# Patient Record
Sex: Female | Born: 1980 | Race: Black or African American | Hispanic: No | Marital: Single | Smoking: Never smoker
Health system: Southern US, Community
[De-identification: ages and names within clinical notes are randomized; demographics above are authoritative.]

## PROBLEM LIST (undated history)

## (undated) ENCOUNTER — Inpatient Hospital Stay (HOSPITAL_COMMUNITY): Payer: Self-pay

## (undated) DIAGNOSIS — R51 Headache: Secondary | ICD-10-CM

## (undated) DIAGNOSIS — R519 Headache, unspecified: Secondary | ICD-10-CM

## (undated) DIAGNOSIS — Z8669 Personal history of other diseases of the nervous system and sense organs: Secondary | ICD-10-CM

## (undated) HISTORY — PX: DILATION AND CURETTAGE OF UTERUS: SHX78

---

## 2012-06-08 DIAGNOSIS — Z9889 Other specified postprocedural states: Secondary | ICD-10-CM

## 2014-02-11 ENCOUNTER — Other Ambulatory Visit (HOSPITAL_COMMUNITY): Payer: Self-pay | Admitting: Nurse Practitioner

## 2014-02-11 DIAGNOSIS — Z3689 Encounter for other specified antenatal screening: Secondary | ICD-10-CM

## 2014-02-11 LAB — OB RESULTS CONSOLE RPR: RPR: NONREACTIVE

## 2014-02-11 LAB — OB RESULTS CONSOLE HEPATITIS B SURFACE ANTIGEN: Hepatitis B Surface Ag: NEGATIVE

## 2014-02-11 LAB — OB RESULTS CONSOLE ABO/RH: RH TYPE: POSITIVE

## 2014-02-11 LAB — OB RESULTS CONSOLE GC/CHLAMYDIA
Chlamydia: NEGATIVE
Gonorrhea: NEGATIVE

## 2014-02-11 LAB — OB RESULTS CONSOLE ANTIBODY SCREEN: ANTIBODY SCREEN: NEGATIVE

## 2014-02-11 LAB — OB RESULTS CONSOLE HIV ANTIBODY (ROUTINE TESTING): HIV: NONREACTIVE

## 2014-02-11 LAB — OB RESULTS CONSOLE RUBELLA ANTIBODY, IGM: Rubella: IMMUNE

## 2014-02-19 ENCOUNTER — Encounter (HOSPITAL_COMMUNITY): Payer: Self-pay

## 2014-02-19 ENCOUNTER — Ambulatory Visit (HOSPITAL_COMMUNITY)
Admission: RE | Admit: 2014-02-19 | Discharge: 2014-02-19 | Disposition: A | Discharge status: Home / Self Care | Payer: BC Managed Care – PPO | Source: Ambulatory Visit | Attending: Nurse Practitioner | Admitting: Nurse Practitioner

## 2014-02-19 DIAGNOSIS — Z36 Encounter for antenatal screening of mother: Secondary | ICD-10-CM | POA: Insufficient documentation

## 2014-02-19 DIAGNOSIS — Z3A20 20 weeks gestation of pregnancy: Secondary | ICD-10-CM | POA: Diagnosis not present

## 2014-02-19 DIAGNOSIS — Z3A19 19 weeks gestation of pregnancy: Secondary | ICD-10-CM | POA: Insufficient documentation

## 2014-02-19 DIAGNOSIS — Z3689 Encounter for other specified antenatal screening: Secondary | ICD-10-CM

## 2014-03-05 NOTE — L&D Delivery Note (Signed)
Delivery Note Pushed well to SVD.  At 4:39 AM a viable and healthy female was delivered via Vaginal, Spontaneous Delivery (Presentation: Left Occiput Anterior).  APGAR: 8, 9; weight  .   Placenta status: Intact, Spontaneous.  Cord: 3 vessels with the following complications: Nuchal x 1 loose, reduced prior to delivery of shoulders.   Anesthesia: Local  Episiotomy:  None Lacerations: 2nd degree Suture Repair: 3.0 vicryl rapide Est. Blood Loss (mL):    Mom to postpartum.  Baby to Couplet care / Skin to Skin.  [facility] LLCWILLIAMS,MARIE 07/21/2014, 5:11 AM

## 2014-06-18 LAB — OB RESULTS CONSOLE GC/CHLAMYDIA
CHLAMYDIA, DNA PROBE: NEGATIVE
GC PROBE AMP, GENITAL: NEGATIVE

## 2014-07-09 ENCOUNTER — Ambulatory Visit (HOSPITAL_COMMUNITY)
Admission: RE | Admit: 2014-07-09 | Discharge: 2014-07-09 | Disposition: A | Discharge status: Home / Self Care | Payer: BLUE CROSS/BLUE SHIELD | Source: Ambulatory Visit | Attending: Nurse Practitioner | Admitting: Nurse Practitioner

## 2014-07-09 ENCOUNTER — Other Ambulatory Visit (HOSPITAL_COMMUNITY): Payer: Self-pay | Admitting: Nurse Practitioner

## 2014-07-09 DIAGNOSIS — Z3A39 39 weeks gestation of pregnancy: Secondary | ICD-10-CM | POA: Diagnosis not present

## 2014-07-09 DIAGNOSIS — O26843 Uterine size-date discrepancy, third trimester: Secondary | ICD-10-CM | POA: Diagnosis present

## 2014-07-11 ENCOUNTER — Encounter (HOSPITAL_COMMUNITY): Payer: Self-pay

## 2014-07-11 ENCOUNTER — Inpatient Hospital Stay (HOSPITAL_COMMUNITY)
Admission: AD | Admit: 2014-07-11 | Discharge: 2014-07-12 | Disposition: A | Discharge status: Home / Self Care | Payer: BLUE CROSS/BLUE SHIELD | Source: Ambulatory Visit | Attending: Obstetrics & Gynecology | Admitting: Obstetrics & Gynecology

## 2014-07-11 DIAGNOSIS — O368130 Decreased fetal movements, third trimester, not applicable or unspecified: Secondary | ICD-10-CM | POA: Insufficient documentation

## 2014-07-11 DIAGNOSIS — Z3A39 39 weeks gestation of pregnancy: Secondary | ICD-10-CM | POA: Insufficient documentation

## 2014-07-11 DIAGNOSIS — O9989 Other specified diseases and conditions complicating pregnancy, childbirth and the puerperium: Secondary | ICD-10-CM | POA: Insufficient documentation

## 2014-07-11 LAB — OB RESULTS CONSOLE GBS: STREP GROUP B AG: NEGATIVE

## 2014-07-11 NOTE — MAU Note (Signed)
Pt states had a "hot flash" and baby was moving more than normal for an hour then abruptly stopped moving for about 15 minutes; started moving again but not as much.

## 2014-07-12 ENCOUNTER — Encounter (HOSPITAL_COMMUNITY): Payer: Self-pay | Admitting: *Deleted

## 2014-07-12 DIAGNOSIS — O368131 Decreased fetal movements, third trimester, fetus 1: Secondary | ICD-10-CM

## 2014-07-12 DIAGNOSIS — Z3A39 39 weeks gestation of pregnancy: Secondary | ICD-10-CM | POA: Diagnosis not present

## 2014-07-12 DIAGNOSIS — Z3A40 40 weeks gestation of pregnancy: Secondary | ICD-10-CM

## 2014-07-12 DIAGNOSIS — O368130 Decreased fetal movements, third trimester, not applicable or unspecified: Secondary | ICD-10-CM | POA: Diagnosis not present

## 2014-07-12 DIAGNOSIS — O9989 Other specified diseases and conditions complicating pregnancy, childbirth and the puerperium: Secondary | ICD-10-CM | POA: Diagnosis not present

## 2014-07-12 NOTE — Discharge Instructions (Signed)

## 2014-07-12 NOTE — MAU Provider Note (Signed)
  History    G3P00020 in with c/o having hot flash then decreased fetal movement for 20 min. She then decided to come in for eval. CSN: [id number]  Arrival date and time: 07/11/14 2344   None     Chief Complaint  Patient presents with  . Decreased Fetal Movement   HPI  OB History    Gravida Para Term Preterm AB TAB SAB Ectopic Multiple Living   3    2  2          Past Medical History  Diagnosis Date  . Medical history non-contributory     Past Surgical History  Procedure Laterality Date  . No past surgeries      No family history on file.  History  Substance Use Topics  . Smoking status: Never Smoker   . Smokeless tobacco: Not on file  . Alcohol Use: No    Allergies: Allergies not on file  No prescriptions prior to admission    Review of Systems  Constitutional: Negative.   HENT: Negative.   Eyes: Negative.   Respiratory: Negative.   Cardiovascular: Negative.   Gastrointestinal: Negative.   Genitourinary: Negative.   Musculoskeletal: Negative.   Skin: Negative.   Neurological: Negative.   Endo/Heme/Allergies: Negative.   Psychiatric/Behavioral: Negative.    Physical Exam   Blood pressure 119/74, pulse 76, temperature 98.3 F (36.8 C), temperature source Oral, resp. rate 18, height 5\' 4"  (1.626 m), weight 161 lb 3.2 oz (73.12 kg), last menstrual period 10/09/2013.  Physical Exam  Constitutional: She is oriented to person, place, and time. She appears well-developed and well-nourished.  HENT:  Head: Normocephalic.  Eyes: Pupils are equal, round, and reactive to light.  Neck: Normal range of motion.  Cardiovascular: Normal rate, regular rhythm, normal heart sounds and intact distal pulses.   Respiratory: Effort normal and breath sounds normal.  GI: Soft. Bowel sounds are normal.  Genitourinary: Vagina normal and uterus normal.  Musculoskeletal: Normal range of motion.  Neurological: She is alert and oriented to person, place, and time. She has  normal reflexes.  Skin: Skin is warm and dry.  Psychiatric: She has a normal mood and affect. Her behavior is normal. Judgment and thought content normal.    MAU Course  Procedures  MDM Reactive FHR tracings  Assessment and Plan  FHR pattern reassurring will d/c home  Dushaun Okey DARLENE 07/12/2014, 12:27 AM

## 2014-07-13 ENCOUNTER — Ambulatory Visit (HOSPITAL_COMMUNITY): Payer: BLUE CROSS/BLUE SHIELD

## 2014-07-16 ENCOUNTER — Other Ambulatory Visit (HOSPITAL_COMMUNITY): Payer: Self-pay | Admitting: Physician Assistant

## 2014-07-16 DIAGNOSIS — O480 Post-term pregnancy: Secondary | ICD-10-CM

## 2014-07-20 ENCOUNTER — Telehealth (HOSPITAL_COMMUNITY): Payer: Self-pay | Admitting: *Deleted

## 2014-07-20 ENCOUNTER — Ambulatory Visit (HOSPITAL_COMMUNITY)
Admission: RE | Admit: 2014-07-20 | Discharge: 2014-07-20 | Disposition: A | Discharge status: Home / Self Care | Payer: BLUE CROSS/BLUE SHIELD | Source: Ambulatory Visit | Attending: Physician Assistant | Admitting: Physician Assistant

## 2014-07-20 ENCOUNTER — Encounter (HOSPITAL_COMMUNITY): Payer: Self-pay | Admitting: *Deleted

## 2014-07-20 ENCOUNTER — Inpatient Hospital Stay (HOSPITAL_COMMUNITY)
Admission: AD | Admit: 2014-07-20 | Discharge: 2014-07-23 | DRG: 775 | Disposition: A | Discharge status: Home / Self Care | Payer: BLUE CROSS/BLUE SHIELD | Source: Ambulatory Visit | Attending: Family Medicine | Admitting: Family Medicine

## 2014-07-20 DIAGNOSIS — Z3A40 40 weeks gestation of pregnancy: Secondary | ICD-10-CM | POA: Diagnosis present

## 2014-07-20 DIAGNOSIS — O4100X0 Oligohydramnios, unspecified trimester, not applicable or unspecified: Secondary | ICD-10-CM | POA: Diagnosis present

## 2014-07-20 DIAGNOSIS — O480 Post-term pregnancy: Secondary | ICD-10-CM | POA: Diagnosis present

## 2014-07-20 DIAGNOSIS — O4103X0 Oligohydramnios, third trimester, not applicable or unspecified: Principal | ICD-10-CM | POA: Diagnosis present

## 2014-07-20 DIAGNOSIS — IMO0001 Reserved for inherently not codable concepts without codable children: Secondary | ICD-10-CM

## 2014-07-20 HISTORY — DX: Headache, unspecified: R51.9

## 2014-07-20 HISTORY — DX: Headache: R51

## 2014-07-20 LAB — CBC
HCT: 36.6 % (ref 36.0–46.0)
HEMOGLOBIN: 12.2 g/dL (ref 12.0–15.0)
MCH: 29 pg (ref 26.0–34.0)
MCHC: 33.3 g/dL (ref 30.0–36.0)
MCV: 87.1 fL (ref 78.0–100.0)
PLATELETS: 120 10*3/uL — AB (ref 150–400)
RBC: 4.2 MIL/uL (ref 3.87–5.11)
RDW: 13.6 % (ref 11.5–15.5)
WBC: 8.6 10*3/uL (ref 4.0–10.5)

## 2014-07-20 LAB — TYPE AND SCREEN
ABO/RH(D): O POS
Antibody Screen: NEGATIVE

## 2014-07-20 LAB — ABO/RH: ABO/RH(D): O POS

## 2014-07-20 MED ORDER — LACTATED RINGERS IV SOLN: 500.0000 mL | INTRAVENOUS | Status: DC | PRN

## 2014-07-20 MED ORDER — LIDOCAINE HCL (PF) 1 % IJ SOLN
30.0000 mL | INTRAMUSCULAR | Status: AC | PRN
  Administered 2014-07-21: 30 mL via SUBCUTANEOUS
  Filled 2014-07-20: qty 30

## 2014-07-20 MED ORDER — LACTATED RINGERS IV SOLN
INTRAVENOUS | Status: DC
  Administered 2014-07-20 (×2): via INTRAVENOUS

## 2014-07-20 MED ORDER — OXYTOCIN 40 UNITS IN LACTATED RINGERS INFUSION - SIMPLE MED: 62.5000 mL/h | INTRAVENOUS | Status: DC

## 2014-07-20 MED ORDER — TERBUTALINE SULFATE 1 MG/ML IJ SOLN: 0.2500 mg | Freq: Once | INTRAMUSCULAR | Status: AC | PRN

## 2014-07-20 MED ORDER — ONDANSETRON HCL 4 MG/2ML IJ SOLN: 4.0000 mg | Freq: Four times a day (QID) | INTRAMUSCULAR | Status: DC | PRN

## 2014-07-20 MED ORDER — MISOPROSTOL 200 MCG PO TABS
50.0000 ug | ORAL_TABLET | ORAL | Status: DC
  Administered 2014-07-20: 50 ug via ORAL

## 2014-07-20 MED ORDER — OXYTOCIN BOLUS FROM INFUSION: 500.0000 mL | INTRAVENOUS | Status: DC

## 2014-07-20 MED ORDER — ACETAMINOPHEN 325 MG PO TABS: 650.0000 mg | ORAL_TABLET | ORAL | Status: DC | PRN

## 2014-07-20 MED ORDER — OXYCODONE-ACETAMINOPHEN 5-325 MG PO TABS: 1.0000 | ORAL_TABLET | ORAL | Status: DC | PRN

## 2014-07-20 MED ORDER — FLEET ENEMA 7-19 GM/118ML RE ENEM: 1.0000 | ENEMA | RECTAL | Status: DC | PRN

## 2014-07-20 MED ORDER — CITRIC ACID-SODIUM CITRATE 334-500 MG/5ML PO SOLN: 30.0000 mL | ORAL | Status: DC | PRN

## 2014-07-20 MED ORDER — OXYCODONE-ACETAMINOPHEN 5-325 MG PO TABS: 2.0000 | ORAL_TABLET | ORAL | Status: DC | PRN

## 2014-07-20 MED ORDER — OXYTOCIN 40 UNITS IN LACTATED RINGERS INFUSION - SIMPLE MED
1.0000 m[IU]/min | INTRAVENOUS | Status: DC
  Administered 2014-07-20: 2 m[IU]/min via INTRAVENOUS
  Filled 2014-07-20: qty 1000

## 2014-07-20 NOTE — H&P (Signed)
LABOR ADMISSION HISTORY AND PHYSICAL  Lindsey Mcbride is a 34 y.o. female G3P0020 with IUP at 5958w4d by LMP presenting for IOL 2/2 oligohydramnios and post-dates. She reports +FM, no contractions, No LOF, no VB, no blurry vision, and RUQ pain.  She plans on wants to breast and bottle feed feeding. She is undecided for birth control.  Of note patient has had 2 spontaneous abortions that she does not want to talk about in front of others.  ROS: +peripheral edema the past month +headaches last 3 days on and off  Dating: By LMP, US was performed at 647w1d.--->  Estimated Date of Delivery: 07/16/14   Prenatal History/Complications:  Past Medical History: Past Medical History  Diagnosis Date  . Medical history non-contributory   . Headache     Past Surgical History: Past Surgical History  Procedure Laterality Date  . No past surgeries      Obstetrical History: OB History    Gravida Para Term Preterm AB TAB SAB Ectopic Multiple Living   3    2  2          Social History: History   Social History  . Marital Status: Single    Spouse Name: N/A  . Number of Children: N/A  . Years of Education: N/A   Social History Main Topics  . Smoking status: Never Smoker   . Smokeless tobacco: Never Used  . Alcohol Use: No  . Drug Use: No  . Sexual Activity: Yes   Other Topics Concern  . None   Social History Narrative    Family History: History reviewed. No pertinent family history.  Allergies: No Known Allergies  Prescriptions prior to admission  Medication Sig Dispense Refill Last Dose  . Prenatal Vit-Fe Fumarate-FA (PRENATAL MULTIVITAMIN) TABS tablet Take 1 tablet by mouth at bedtime.   07/19/2014 at Unknown time     Review of Systems   All systems reviewed and negative except as stated in HPI  BP 123/79 mmHg  Pulse 77  Temp(Src) 98.1 F (36.7 C) (Oral)  Resp 18  Ht 5\' 4"  (1.626 m)  Wt 164 lb (74.39 kg)  BMI 28.14 kg/m2  LMP 10/09/2013 General appearance:  alert, cooperative and no distress Lungs: clear to auscultation bilaterally Heart: regular rate and rhythm Abdomen: soft, non-tender; bowel sounds normal Pelvic: 1/50/-3 Extremities: Homans sign is negative, no sign of DVT, edema DTR's present Presentation: cephalic Fetal monitoringBaseline: 150 bpm Uterine activityNone Dilation: 1 Effacement (%): 50 Station: -3 Exam by:: Dr. Loreta AveAcosta   Prenatal labs: ABO, Rh: --/--/O POS (05/17 1535)  Antibody: PENDING (05/17 1535) Rubella:   Immune RPR: Nonreactive (12/10 0000)  HBsAg: Negative (12/10 0000)  HIV: Non-reactive (12/10 0000)  GBS: Negative (05/08 0000)  1 hr Glucola 156, 3hr CTT 91/171/138/139 Genetic screening  Declined  Prenatal Transfer Tool  Maternal Diabetes: No Genetic Screening: Declined Maternal Ultrasounds/Referrals: Normal Fetal Ultrasounds or other Referrals:  None Maternal Substance Abuse:  No Significant Maternal Medications:  None Significant Maternal Lab Results: None  Results for orders placed or performed during the hospital encounter of 07/20/14 (from the past 24 hour(s))  CBC   Collection Time: 07/20/14  3:35 PM  Result Value Ref Range   WBC 8.6 4.0 - 10.5 K/uL   RBC 4.20 3.87 - 5.11 MIL/uL   Hemoglobin 12.2 12.0 - 15.0 g/dL   HCT 16.136.6 09.636.0 - 04.546.0 %   MCV 87.1 78.0 - 100.0 fL   MCH 29.0 26.0 - 34.0 pg   MCHC 33.3  30.0 - 36.0 g/dL   RDW 16.113.6 09.611.5 - 04.515.5 %   Platelets 120 (L) 150 - 400 K/uL  Type and screen   Collection Time: 07/20/14  3:35 PM  Result Value Ref Range   ABO/RH(D) O POS    Antibody Screen PENDING    Sample Expiration 07/23/2014     Patient Active Problem List   Diagnosis Date Noted  . Oligohydramnios 07/20/2014  . Encounter for fetal anatomic survey   . [redacted] weeks gestation of pregnancy     Assessment: Lindsey Mcbride Aure is a 34 y.o. G3P0020 at 6466w4d here for IOL for oligohydramnios from clinic.   #Labor: IOL for oligohydramnios.   -Will place FB and start PO  cytotec #Pain: Patient wanting no pain medications at this time.  #FWB:  NST reassuring, CAT1,  no contractions currently #ID: GBS/HIV/RPR neg #MOF: Breast and Bottle #MOC: Undecided #Circ:  If its a boy want a circ. (FAMILY DOES NOT KNOW SEX OF BABY; wants it to be a surprise.)  Lindsey AdaJazma Phelps, DO PGY-1, [facility]   OB fellow attestation:  I have seen and examined this patient; I agree with above documentation in the resident's note.   Lindsey Mcbride is a 34 y.o. G3P0020 here for IOL 3/3 oligo  PE: BP 123/79 mmHg  Pulse 77  Temp(Src) 98.1 F (36.7 C) (Oral)  Resp 18  Ht 5\' 4"  (1.626 m)  Wt 164 lb (74.39 kg)  BMI 28.14 kg/m2  LMP 10/09/2013 Gen: calm comfortable, NAD Resp: normal effort, no distress Abd: gravid  ROS, labs, PMH reviewed  Plan: MOF: breast MOC: undecided ID: GBS neg FWB: cat I Labor: FB and PO cytotec Pain: declines  Lindsey Mcbride 07/20/2014, 7:39 PM

## 2014-07-20 NOTE — Telephone Encounter (Signed)
Preadmission screen  

## 2014-07-20 NOTE — Progress Notes (Signed)
Lindsey Mcbride is a 34 y.o. G3P0020 at 2785w4d by ultrasound admitted for induction of labor due to Low amniotic fluid..  Subjective: Doing well. Foley out @ 1900  Objective: BP 127/78 mmHg  Pulse 86  Temp(Src) 98.3 F (36.8 C) (Oral)  Resp 16  Ht 5\' 4"  (1.626 m)  Wt 164 lb (74.39 kg)  BMI 28.14 kg/m2  LMP 10/09/2013      FHT:  FHR: 135 bpm, variability: moderate,  accelerations:  Present,  decelerations:  Absent UC:   regular, every 4 minutes SVE:   Dilation: 1 Effacement (%): 50 Station: -3 Exam by:: Dr. Loreta AveAcosta  Labs: Lab Results  Component Value Date   WBC 8.6 07/20/2014   HGB 12.2 07/20/2014   HCT 36.6 07/20/2014   MCV 87.1 07/20/2014   PLT 120* 07/20/2014    Assessment / Plan: Induction of labor due to oligohydramnios,  progressing well on pitocin  Labor: Progressing normally and Will start Pitocin Preeclampsia:  n/a Fetal Wellbeing:  Category I Pain Control:  Labor support without medications I/D:  n/a Anticipated MOD:  NSVD  Rita Prom 07/20/2014, 9:03 PM

## 2014-07-21 ENCOUNTER — Encounter (HOSPITAL_COMMUNITY): Payer: Self-pay

## 2014-07-21 DIAGNOSIS — O480 Post-term pregnancy: Secondary | ICD-10-CM

## 2014-07-21 DIAGNOSIS — IMO0001 Reserved for inherently not codable concepts without codable children: Secondary | ICD-10-CM

## 2014-07-21 DIAGNOSIS — O4103X0 Oligohydramnios, third trimester, not applicable or unspecified: Secondary | ICD-10-CM

## 2014-07-21 LAB — RPR: RPR Ser Ql: NONREACTIVE

## 2014-07-21 MED ORDER — SENNOSIDES-DOCUSATE SODIUM 8.6-50 MG PO TABS
2.0000 | ORAL_TABLET | ORAL | Status: DC
  Administered 2014-07-21 – 2014-07-22 (×2): 2 via ORAL
  Filled 2014-07-21 (×2): qty 2

## 2014-07-21 MED ORDER — BENZOCAINE-MENTHOL 20-0.5 % EX AERO
1.0000 "application " | INHALATION_SPRAY | CUTANEOUS | Status: DC | PRN
  Administered 2014-07-21: 1 via TOPICAL
  Filled 2014-07-21: qty 56

## 2014-07-21 MED ORDER — ACETAMINOPHEN 325 MG PO TABS
650.0000 mg | ORAL_TABLET | ORAL | Status: DC | PRN
  Administered 2014-07-21 (×2): 650 mg via ORAL
  Filled 2014-07-21 (×2): qty 2

## 2014-07-21 MED ORDER — ZOLPIDEM TARTRATE 5 MG PO TABS: 5.0000 mg | ORAL_TABLET | Freq: Every evening | ORAL | Status: DC | PRN

## 2014-07-21 MED ORDER — TERBUTALINE SULFATE 1 MG/ML IJ SOLN
0.2500 mg | Freq: Once | INTRAMUSCULAR | Status: AC | PRN
  Filled 2014-07-21: qty 1

## 2014-07-21 MED ORDER — LANOLIN HYDROUS EX OINT: TOPICAL_OINTMENT | CUTANEOUS | Status: DC | PRN

## 2014-07-21 MED ORDER — DIPHENHYDRAMINE HCL 25 MG PO CAPS: 25.0000 mg | ORAL_CAPSULE | Freq: Four times a day (QID) | ORAL | Status: DC | PRN

## 2014-07-21 MED ORDER — FENTANYL CITRATE (PF) 100 MCG/2ML IJ SOLN
50.0000 ug | INTRAMUSCULAR | Status: DC | PRN
  Administered 2014-07-21 (×3): 50 ug via INTRAVENOUS
  Filled 2014-07-21 (×3): qty 2

## 2014-07-21 MED ORDER — IBUPROFEN 600 MG PO TABS
600.0000 mg | ORAL_TABLET | Freq: Four times a day (QID) | ORAL | Status: DC
  Administered 2014-07-21 – 2014-07-23 (×11): 600 mg via ORAL
  Filled 2014-07-21 (×11): qty 1

## 2014-07-21 MED ORDER — PRENATAL MULTIVITAMIN CH
1.0000 | ORAL_TABLET | Freq: Every day | ORAL | Status: DC
  Administered 2014-07-21 – 2014-07-23 (×3): 1 via ORAL
  Filled 2014-07-21 (×3): qty 1

## 2014-07-21 MED ORDER — OXYCODONE-ACETAMINOPHEN 5-325 MG PO TABS: 2.0000 | ORAL_TABLET | ORAL | Status: DC | PRN

## 2014-07-21 MED ORDER — TETANUS-DIPHTH-ACELL PERTUSSIS 5-2.5-18.5 LF-MCG/0.5 IM SUSP: 0.5000 mL | Freq: Once | INTRAMUSCULAR | Status: DC

## 2014-07-21 MED ORDER — ONDANSETRON HCL 4 MG/2ML IJ SOLN: 4.0000 mg | INTRAMUSCULAR | Status: DC | PRN

## 2014-07-21 MED ORDER — DIBUCAINE 1 % RE OINT: 1.0000 "application " | TOPICAL_OINTMENT | RECTAL | Status: DC | PRN

## 2014-07-21 MED ORDER — SIMETHICONE 80 MG PO CHEW: 80.0000 mg | CHEWABLE_TABLET | ORAL | Status: DC | PRN

## 2014-07-21 MED ORDER — OXYCODONE-ACETAMINOPHEN 5-325 MG PO TABS: 1.0000 | ORAL_TABLET | ORAL | Status: DC | PRN

## 2014-07-21 MED ORDER — WITCH HAZEL-GLYCERIN EX PADS: 1.0000 "application " | MEDICATED_PAD | CUTANEOUS | Status: DC | PRN

## 2014-07-21 MED ORDER — ONDANSETRON HCL 4 MG PO TABS: 4.0000 mg | ORAL_TABLET | ORAL | Status: DC | PRN

## 2014-07-21 NOTE — Lactation Note (Signed)
This note was copied from the chart of Lindsey Mcbride. Lactation Consultation Note  Initial visit attempted at 9 hours of life.  Baby getting ready to have bath. Will follow up.  RN has reviewed hand expression with mother. Mom made aware of O/P services, breastfeeding support groups, community resources, and our phone # for post-discharge questions.    Patient Name: Lindsey Foster Simpsonmarech Abdella Today's Date: 07/21/2014     Maternal Data    Feeding Feeding Type: Breast Fed Length of feed: 20 min  LATCH Score/Interventions Latch: Repeated attempts needed to sustain latch, nipple held in mouth throughout feeding, stimulation needed to elicit sucking reflex. Intervention(s): Assist with latch  Audible Swallowing: A few with stimulation Intervention(s): Skin to skin;Hand expression  Type of Nipple: Everted at rest and after stimulation  Comfort (Breast/Nipple): Soft / non-tender     Hold (Positioning): Assistance needed to correctly position infant at breast and maintain latch.  LATCH Score: 7  Lactation Tools Discussed/Used     Consult Status      Dahlia ByesBerkelhammer, Ruth [facility] 07/21/2014, 2:00 PM

## 2014-07-21 NOTE — Lactation Note (Signed)
This note was copied from the chart of Lindsey Mcbride. Lactation Consultation Note  Noted baby sucking on tongue.  Reviewed suck training with mother. Demonstrated how to hand express and mother was able to express drops of colostrum. Assisted mother in both football on L breast and side lying position on R breast. With compression, sucks and swallows observed. Reviewed cluster feeding and waiting until baby has wide gape before latching. Mom encouraged to feed baby 8-12 times/24 hours and with feeding cues.  Mom made aware of O/P services, breastfeeding support groups, community resources, and our phone # for post-discharge questions.    Patient Name: Lindsey Foster Simpsonmarech Caissie Today's Date: 07/21/2014 Reason for consult: Initial assessment   Maternal Data Has patient been taught Hand Expression?: Yes Does the patient have breastfeeding experience prior to this delivery?: No  Feeding Feeding Type: Breast Fed Length of feed: 20 min  LATCH Score/Interventions Latch: Grasps breast easily, tongue down, lips flanged, rhythmical sucking. Intervention(s): Adjust position;Assist with latch;Breast massage  Audible Swallowing: A few with stimulation Intervention(s): Skin to skin;Hand expression  Type of Nipple: Everted at rest and after stimulation  Comfort (Breast/Nipple): Soft / non-tender     Hold (Positioning): Assistance needed to correctly position infant at breast and maintain latch.  LATCH Score: 8  Lactation Tools Discussed/Used     Consult Status Consult Status: Follow-up Date: 07/22/14 Follow-up type: In-patient    Dahlia ByesBerkelhammer, Janda Cargo [facility] 07/21/2014, 2:40 PM

## 2014-07-22 NOTE — Lactation Note (Signed)
This note was copied from the chart of Lindsey Brittnie Meras. Lactation Consultation Note  Patient Name: Lindsey Mcbride EXBMW'UToday's Date: 07/22/2014 Reason for consult: Follow-up assessment Baby 35 hours of life. Mom called out for a DEBP. Mom states that baby continues to tongue-suck/thrust, and pushes breast out of mouth. Mom reports that she is able to hand express colostrum and baby latches well initially, but won't maintain a latch. Set mom up with DEBP and started her pumping. Enc mom to pump every 3 hours for 15 minutes. Mom states that baby was just at the breast and is sleeping well now in the arms of family member. Enc mom to put baby to breast first with cues, then use DEBP and give baby whatever she gets from pumping. Enc mom to hand express after pumping. Mom given curve-tipped syringe with instructions to use with clean/gloved finger for additional suck-training. Assisted mom to hand express after pumping and obtained a few drops which were given to baby. Assisted mom to latch baby onto breast first in football position, and then in cross-cradle with improved latched. Mom reports no pinching of nipple in cross-cradle position. Changed baby's diaper, and it was a transitional stool/brown. Enc mom to keep nursing, and pumping, and then depending on how much she is getting with DEBP and how satisfied baby is at the breast, baby may need to be supplemented with formula. Mom not wanting to supplement now. Gave mom the nursery Fax number for her insurance company to fax over a document for her OB to fill out in the morning for a DEBP. Discussed assessment, interventions, and plan with patient's RN, Juliette AlcideMelinda.    Maternal Data    Feeding Length of feed: 10 min  LATCH Score/Interventions                      Lactation Tools Discussed/Used Pump Review: Setup, frequency, and cleaning Initiated by:: JW Date initiated:: 07/23/14   Consult Status Consult Status: Follow-up Date:  07/23/14 Follow-up type: In-patient    Geralynn OchsWILLIARD, Damion Kant 07/22/2014, 4:28 PM

## 2014-07-22 NOTE — Progress Notes (Cosign Needed)
Post Partum Day #1 Subjective: no complaints, up ad lib, voiding, tolerating PO and no flatus. Not comfortable going home today, continues to work on breastfeeding.  Objective: Blood pressure 110/56, pulse 78, temperature 98.1 F (36.7 C), temperature source Oral, resp. rate 18, height 5\' 4"  (1.626 m), weight 74.39 kg (164 lb), last menstrual period 10/09/2013, SpO2 100 %, unknown if currently breastfeeding.  Physical Exam:  General: alert and cooperative, well-appearing, NAD Lochia: appropriate Uterine Fundus: firm, at umbilicus DVT Evaluation: No evidence of DVT seen on physical exam. Negative Homan's sign. No cords or calf tenderness. No significant calf/ankle edema.   Recent Labs  07/20/14 1535  HGB 12.2  HCT 36.6    Assessment/Plan: Lindsey Mcbride is a 34 y.o. G3P1021 at 7321w5d delivered healthy baby girl on 5/18 at 0430 without complications. GBS negative - Continue breast and bottle feeding - Pain controlled - Continue PO and ambulation, no flatus yet - Undecided contraception - Anticipate DC tomorrow   LOS: 2 days   Saralyn PilarKaramalegos, Alexander 07/22/2014, 8:47 AM

## 2014-07-23 ENCOUNTER — Inpatient Hospital Stay (HOSPITAL_COMMUNITY): Admission: RE | Admit: 2014-07-23 | Payer: BLUE CROSS/BLUE SHIELD | Source: Ambulatory Visit

## 2014-07-23 MED ORDER — IBUPROFEN 600 MG PO TABS: 600.0000 mg | ORAL_TABLET | Freq: Four times a day (QID) | ORAL | Status: DC

## 2014-07-23 MED ORDER — DOCUSATE SODIUM 100 MG PO CAPS: 100.0000 mg | ORAL_CAPSULE | Freq: Two times a day (BID) | ORAL | Status: DC

## 2014-07-23 NOTE — Lactation Note (Signed)
This note was copied from the chart of Lindsey Mahiya Convey. Lactation Consultation Note  Patient Name: Lindsey Mcbride: 07/23/2014 Reason for consult: Follow-up assessment Mom reports baby has been popping on/off the breast. She has nipple pain with nursing. Positional stripes present bilateral. Both nipples are erect but with short shaft. Baby is tongue thrusting with nursing per Mom and LC observed this with this feeding. LC worked with Mom with positioning, bringing bottom lip down for more depth but Mom reported continued nipple pain. PS=7 decreasing to 5. Compression line visible with nursing. Initiated #20 nipple shield and Mom reported some discomfort with initial latch but this resolved with baby nursing. Baby did not pop on/off the breast, sustained more depth with latch. No compression line visible at the end of the feeding, right nipple with superficial cracking, no bleeding. Scant amount of colostrum in nipple shield, colostrum present with hand expression. Mom pumping received a few drops, with hand expression received approx 1 ml, demonstrated to Mom how to spoon feed small amounts back to baby. Baby acting very hungry and Mom concerned about weight loss at 8%, wants to supplement. LC assisted and Mom demonstrated how to supplement finger feeding using curved tipped syringe. LC advised Mom that baby should be at the breast 8-12 times or more in 24 hours, nursing 15-30 minutes both breasts each feeding. If baby satisfied, breasts soft after nursing, adequate voids/stools and Mom is observing breast milk in the nipple shield she does not have to supplement. However, if baby not satisfied at the breast with nursing frequently, Mom not observing BM in nipple shield, inadequate voids/stools, then Mom to supplement with feedings according to guidelines given. Mom reports her breast are feeling heavier between feedings and baby's stools are changing color so feel Mom's milk will be in  soon. Mom plans to rent DEBP for home use till her pump arrives from insurance company. Encouraged to pump after feedings to have EBM to supplement with instead of formula. Give baby back any amount of EBM she receives.  Care for sore nipples reviewed, comfort gels given with instructions. OP f/u with lactation scheduled for Thursday, 07/29/14 at 1:00 pm.   Maternal Data    Feeding Feeding Type: Breast Fed Length of feed: 14 min  LATCH Score/Interventions Latch: Repeated attempts needed to sustain latch, nipple held in mouth throughout feeding, stimulation needed to elicit sucking reflex. Intervention(s): Adjust position;Assist with latch;Breast massage;Breast compression  Audible Swallowing: A few with stimulation  Type of Nipple: Everted at rest and after stimulation (short nipple shaft bilateral/aerola edema)  Comfort (Breast/Nipple): Filling, red/small blisters or bruises, mild/mod discomfort  Problem noted: Cracked, bleeding, blisters, bruises Interventions  (Cracked/bleeding/bruising/blister): Expressed breast milk to nipple;Other (comment) (comfort gels)  Hold (Positioning): Assistance needed to correctly position infant at breast and maintain latch. Intervention(s): Breastfeeding basics reviewed;Support Pillows;Position options;Skin to skin  LATCH Score: 6  Lactation Tools Discussed/Used Tools: Nipple Shields;Pump;Comfort gels Nipple shield size: 20 Breast pump type: Double-Electric Breast Pump   Consult Status Consult Status: Follow-up Mcbride: 07/23/14 Follow-up type: In-patient    Alfred LevinsGranger, Sulma Ruffino Ann 07/23/2014, 9:35 AM

## 2014-07-23 NOTE — Discharge Summary (Signed)
Obstetric Discharge Summary Reason for Admission: induction of labor, oligohydramnios and post-dates Prenatal Procedures: NST and ultrasound Intrapartum Procedures: spontaneous vaginal delivery Postpartum Procedures: none Complications-Operative and Postpartum: 2nd degree perineal laceration s/p repair HEMOGLOBIN  Date Value Ref Range Status  07/20/2014 12.2 12.0 - 15.0 g/dL Final   HCT  Date Value Ref Range Status  07/20/2014 36.6 36.0 - 46.0 % Final    Discharge Diagnoses: Post-date pregnancy delivered. Oligohydramnios  Hospital Course:  Lindsey Mcbride is a 34 y.o. G3P1021 at 6465w5d who was admitted on 07/20/14 for IOL for post-dates with oligohydramnios. Pregnancy was uncomplicated. Maternal GBS negative. Progressed to vaginal delivery within 24 hours (see copied delivery note below). Postpartum course was uncomplicated, tolerating PO and ambulation, pain controlled, bleeding improved, +flatus/BM, and no barriers to discharge. Plan for continue breast feeding, contraception (undecided), follow-up in 4-6 weeks for postpartum visit.  Delivery Note Pushed well to SVD. At 4:39 AM a viable and healthy female was delivered via Vaginal, Spontaneous Delivery (Presentation: Left Occiput Anterior). APGAR: 8, 9; weight .  Placenta status: Intact, Spontaneous. Cord: 3 vessels with the following complications: Nuchal x 1 loose, reduced prior to delivery of shoulders.   Anesthesia: Local  Episiotomy: None Lacerations: 2nd degree Suture Repair: 3.0 vicryl rapide Est. Blood Loss (mL):   Mom to postpartum. Baby to Couplet care / Skin to Skin.  Wynelle BourgeoisWILLIAMS,MARIE 07/21/2014, 5:11 AM   Physical Exam:  General: alert and cooperative, well-appearing, NAD Lochia: appropriate Uterine Fundus: firm DVT Evaluation: No evidence of DVT seen on physical exam. Negative Homan's sign. No cords or calf tenderness. No significant calf/ankle edema.  Discharge Information: Date: 07/23/2014 Activity:  pelvic rest Diet: routine Medications: PNV, Ibuprofen and Colace Baby feeding: plans to breastfeed, bottle supplement Contraception: undecided Condition: stable Instructions: refer to practice specific booklet Discharge to: home   Newborn Data: Live born female  Birth Weight: 7 lb 8.3 oz (3410 g) APGAR: 8, 9  Anticipated discharge home with mother.  Saralyn PilarAlexander Karamalegos, DO [facility], PGY-2 07/23/2014, 2:05 AM  I was present for the exam and agree with above.  [place] Jackeline Gutknecht, CNM 07/23/2014 10:52 AM

## 2014-07-23 NOTE — Discharge Instructions (Signed)
For your next post-partum (after pregnancy visit) in 4 to 6 weeks, we recommend that you stay with the [facility] Department for now.  In future you can switch to [facility] (but not for next postpartum visit) [address] [place] [id number] [phone] [facility] Call In future to establish care for routine care as needed (next visit should be with Health Dept after pregnancy)  Postpartum Care After Vaginal Delivery After you deliver your newborn (postpartum period), the usual stay in the hospital is 24-72 hours. If there were problems with your labor or delivery, or if you have other medical problems, you might be in the hospital longer.  While you are in the hospital, you will receive help and instructions on how to care for yourself and your newborn during the postpartum period.  While you are in the hospital:  Be sure to tell your nurses if you have pain or discomfort, as well as where you feel the pain and what makes the pain worse.  If you had an incision made near your vagina (episiotomy) or if you had some tearing during delivery, the nurses may put ice packs on your episiotomy or tear. The ice packs may help to reduce the pain and swelling.  If you are breastfeeding, you may feel uncomfortable contractions of your uterus for a couple of weeks. This is normal. The contractions help your uterus get back to normal size.  It is normal to have some bleeding after delivery.  For the first 1-3 days after delivery, the flow is red and the amount may be similar to a period.  It is common for the flow to start and stop.  In the first few days, you may pass some small clots. Let your nurses know if you begin to pass large clots or your flow increases.  Do not  flush blood clots down the toilet before having the nurse look at them.  During the next 3-10 days after delivery, your flow should become more watery and pink or brown-tinged in color.  Ten  to fourteen days after delivery, your flow should be a small amount of yellowish-white discharge.  The amount of your flow will decrease over the first few weeks after delivery. Your flow may stop in 6-8 weeks. Most women have had their flow stop by 12 weeks after delivery.  You should change your sanitary pads frequently.  Wash your hands thoroughly with soap and water for at least 20 seconds after changing pads, using the toilet, or before holding or feeding your newborn.  You should feel like you need to empty your bladder within the first 6-8 hours after delivery.  In case you become weak, lightheaded, or faint, call your nurse before you get out of bed for the first time and before you take a shower for the first time.  Within the first few days after delivery, your breasts may begin to feel tender and full. This is called engorgement. Breast tenderness usually goes away within 48-72 hours after engorgement occurs. You may also notice milk leaking from your breasts. If you are not breastfeeding, do not stimulate your breasts. Breast stimulation can make your breasts produce more milk.  Spending as much time as possible with your newborn is very important. During this time, you and your newborn can feel close and get to know each other. Having your newborn stay in your room (rooming in) will help to strengthen the bond with your newborn. It will  give you time to get to know your newborn and become comfortable caring for your newborn.  Your hormones change after delivery. Sometimes the hormone changes can temporarily cause you to feel sad or tearful. These feelings should not last more than a few days. If these feelings last longer than that, you should talk to your caregiver.  If desired, talk to your caregiver about methods of family planning or contraception.  Talk to your caregiver about immunizations. Your caregiver may want you to have the following immunizations before leaving the  hospital:  Tetanus, diphtheria, and pertussis (Tdap) or tetanus and diphtheria (Td) immunization. It is very important that you and your family (including grandparents) or others caring for your newborn are up-to-date with the Tdap or Td immunizations. The Tdap or Td immunization can help protect your newborn from getting ill.  Rubella immunization.  Varicella (chickenpox) immunization.  Influenza immunization. You should receive this annual immunization if you did not receive the immunization during your pregnancy. Document Released: 12/17/2006 Document Revised: 11/14/2011 Document Reviewed: 10/17/2011 [facility] Patient Information 2015 [employer] [place]. This information is not intended to replace advice given to you by your health care provider. Make sure you discuss any questions you have with your health care provider.

## 2014-07-24 ENCOUNTER — Ambulatory Visit: Payer: Self-pay

## 2014-07-24 NOTE — Lactation Note (Addendum)
This note was copied from the chart of Lindsey Mcbride. Lactation Consultation Note  Mother states baby is still tongue thrusting but improving, not popping on and off as much.  Still has discomfort and is using gels and ebm. She has recently pumped 15 ml of breastmilk and gave to baby. Reviewed engorgement care and monitoring voids/stools. Mother has outpt appt for 5/26 1pm.  Mother would like 2 week pump rental until her pump arrives.  Provided pump. Mother latched baby using #20NS. Encouraged depth and baby seems fussy so prefilled with formula. Sucks and a few swallows observed.  Reminded mother that baby needs to be on the entire shield for milk transfer.   Mother's breasts are engorged.  Encouraged massage to empty breast during feedings. Mom encouraged to feed baby 8-12 times/24 hours and with feeding cues.  Suggest she post pump to soften and apply ice packs.  Patient Name: Lindsey Foster Simpsonmarech [place] ZOXWR'UToday's Date: 07/24/2014 Reason for consult: Follow-up assessment   Maternal Data    Feeding Feeding Type: Breast Milk  LATCH Score/Interventions                      Lactation Tools Discussed/Used Tools: Nipple Shields (Curved-tip syringe) Nipple shield size: 20 Breast pump type: Double-Electric Breast Pump   Consult Status Consult Status: Follow-up Date: 07/24/14 Follow-up type: In-patient    Dahlia ByesBerkelhammer, [place] [facility] 07/24/2014, 11:08 AM

## 2014-07-26 ENCOUNTER — Inpatient Hospital Stay (HOSPITAL_COMMUNITY): Admission: RE | Admit: 2014-07-26 | Payer: BLUE CROSS/BLUE SHIELD | Source: Ambulatory Visit

## 2014-07-28 ENCOUNTER — Telehealth: Payer: Self-pay | Admitting: Obstetrics & Gynecology

## 2014-07-28 NOTE — Telephone Encounter (Signed)
[facility] RN home BP=142/90.  Pt had headache that went away with Tylenol.  Denies scotomata, RUQ pain.  Has some LE edema but decreasing.  [facility] start RN to take BP tomorrow at 1 pm.. If elevated, will have patient come to outpatient clinic for labs and rpt BP.  [facility] symptoms of severe preeclampsia occur.

## 2014-07-29 ENCOUNTER — Ambulatory Visit (HOSPITAL_COMMUNITY)
Admit: 2014-07-29 | Discharge: 2014-07-29 | Disposition: A | Discharge status: Home / Self Care | Payer: BLUE CROSS/BLUE SHIELD | Attending: Family Medicine | Admitting: Family Medicine

## 2014-07-29 NOTE — Lactation Note (Signed)
Lactation Consult  Mother's reason for visit: Mother was fit with a #20 nipple shield while in the hospital due to feeding difficulty Visit Type: feeding assessment Appointment Notes: Mother states that infant had an 11 % weight loss on discharge. She is breastfeeding using a #20 nipple shield. Mother is supplementing infant with EBM/formula 4 times daily giving 60 ml . Consult:  Initial Lactation Consultant:  Michel Bickers  ________________________________________________________________________ Joan Flores Name: Lindsey Mcbride Date of Birth: 07/21/2014 Pediatrician: Dr Caryl Comes Gender: female Gestational Age: [redacted]w[redacted]d (At Birth) Birth Weight: 7 lb 8.3 oz (3410 g) Weight at Discharge: Weight: 6 lb 13.7 oz (3110 g)Date of Discharge: 07/24/2014 [facility] Weights   07/22/14 2303 07/23/14 2326 07/24/14 1149  Weight: 6 lb 14.2 oz (3125 g) 6 lb 10.7 oz (3025 g) 6 lb 13.7 oz (3110 g)   Weight today at 106 days old: 7-2.8,3294      ________________________________________________________________________  Mother's Name: Lindsey Mcbride Type of delivery:  vaginal del Breastfeeding Experience:  none Maternal Medical Conditions:  none Maternal Medications:  Prenatal vits, ibuprophen  ________________________________________________________________________  Breastfeeding History (Post Discharge)  Frequency of breastfeeding: every 2-3 hours Duration of feeding:  10-15 mins on each breast  Mother is post pumping and getting 30-60 ml. She is supplementing infant with ebm and formula at least 4 times daily  Infant Intake and Output Assessment  Voids:  6-7 in 24 hrs.  Color:  Clear yellow Stools:  1-3 in 24 hrs.  Color:  Yellow  ________________________________________________________________________  Maternal Breast Assessment  Breast:  Full Nipple:  Erect Pain level:  0 Pain interventions:   Bra  _______________________________________________________________________ Feeding Assessment/Evaluation:  Mother was observed placing #20 nipple shield on the Right breast. Infant latched with a poor latch. Assist mother with latching infant on the bare breast. Infant latched well with good depth. Observed frequent suckling and audible swallows. Mother denied having any discomfort with proper latch. Observed slight pinching on the tip of her nipple when infant released the breast. Mother's breast very full and firm before feeding. She states she didn't pump before visit. She states that this is the best feeding infant has had.     Infant's oral assessment:  Variance. Observed that infant has a slight upper lip tie and a posterior tongue tie. Informed mother of tightness of infants tongue. Advised if latch was pinchy or painful to use the nipple shield  Positioning:  Cross cradle Left breast  LATCH documentation:  Latch:  2 = Grasps breast easily, tongue down, lips flanged, rhythmical sucking.  Audible swallowing:  2 = Spontaneous and intermittent  Type of nipple:  2 = Everted at rest and after stimulation  Comfort (Breast/Nipple):  1 = Filling, red/small blisters or bruises, mild/mod discomfort  Hold (Positioning):  1 = Assistance needed to correctly position infant at breast and maintain latch  LATCH score:  8  Attached assessment:  Deep  Lips flanged:  No.  Lips untucked:  Yes.    Suck assessment:  Nutritive and Nonnutritive   Pre-feed weight: 3294 g, 7-2.8 Post-feed weight: 3410 g, 7-4.8  Amount transferred:   116 ml   Additional Feeding Assessment - infant placed on alternate breast to show mother how to properly apply nipple shield. Mother was fit with a #24 nipple shield for a better fit.    Positioning:  Football Left breast  LATCH documentation:  Latch:  2 = Grasps breast easily, tongue down, lips flanged, rhythmical sucking.  Audible swallowing:  2 = Spontaneous  and intermittent  Type of nipple:  2 = Everted at rest and after stimulation  Comfort (Breast/Nipple):  1 = Filling, red/small blisters or bruises, mild/mod discomfort  Hold (Positioning):  1 = Assistance needed to correctly position infant at breast and maintain latch  LATCH score:  8  Attached assessment:  Deep  Lips flanged:  Yes.    Lips untucked:  Yes.    Suck assessment:  Nutritive and Nonnutritive  Tools:  Nipple shield 24 mm Instructed on use and cleaning of tool:  Yes.    Pre-feed weight 3410 Post-feed weight:  3416 Amount transferred:  6 ml    Total amount pumped post feed:116  R  ml    L 6 ml  Total amount transferred:  126 ml Advised mother to continue to breastfeed 8-12 times daily and with feeding cues Work on good depth with or without the nipple shield Continue to post pump 4-6 times daily for 15-20 mins Follow in one week for feeding assessment with LC Ad[facility]  [facility] prn

## 2014-08-05 ENCOUNTER — Encounter (HOSPITAL_COMMUNITY): Payer: Self-pay | Admitting: *Deleted

## 2014-08-05 ENCOUNTER — Ambulatory Visit: Payer: BLUE CROSS/BLUE SHIELD | Admitting: *Deleted

## 2014-08-05 ENCOUNTER — Inpatient Hospital Stay (HOSPITAL_COMMUNITY)
Admission: AD | Admit: 2014-08-05 | Discharge: 2014-08-05 | Disposition: A | Discharge status: Home / Self Care | Payer: BLUE CROSS/BLUE SHIELD | Source: Ambulatory Visit | Attending: Obstetrics & Gynecology | Admitting: Obstetrics & Gynecology

## 2014-08-05 VITALS — BP 140/70 | HR 82

## 2014-08-05 DIAGNOSIS — I158 Other secondary hypertension: Secondary | ICD-10-CM | POA: Diagnosis not present

## 2014-08-05 DIAGNOSIS — R030 Elevated blood-pressure reading, without diagnosis of hypertension: Secondary | ICD-10-CM | POA: Diagnosis present

## 2014-08-05 DIAGNOSIS — O139 Gestational [pregnancy-induced] hypertension without significant proteinuria, unspecified trimester: Secondary | ICD-10-CM | POA: Diagnosis not present

## 2014-08-05 DIAGNOSIS — O9089 Other complications of the puerperium, not elsewhere classified: Secondary | ICD-10-CM | POA: Insufficient documentation

## 2014-08-05 DIAGNOSIS — Z0130 Encounter for examination of blood pressure without abnormal findings: Secondary | ICD-10-CM

## 2014-08-05 DIAGNOSIS — O135 Gestational [pregnancy-induced] hypertension without significant proteinuria, complicating the puerperium: Secondary | ICD-10-CM

## 2014-08-05 LAB — URINE MICROSCOPIC-ADD ON

## 2014-08-05 LAB — URINALYSIS, ROUTINE W REFLEX MICROSCOPIC
Bilirubin Urine: NEGATIVE
GLUCOSE, UA: NEGATIVE mg/dL
Ketones, ur: NEGATIVE mg/dL
NITRITE: NEGATIVE
Protein, ur: NEGATIVE mg/dL
Specific Gravity, Urine: 1.01 (ref 1.005–1.030)
Urobilinogen, UA: 0.2 mg/dL (ref 0.0–1.0)
pH: 5 (ref 5.0–8.0)

## 2014-08-05 LAB — COMPREHENSIVE METABOLIC PANEL
ALT: 38 U/L (ref 14–54)
AST: 37 U/L (ref 15–41)
Albumin: 3.4 g/dL — ABNORMAL LOW (ref 3.5–5.0)
Alkaline Phosphatase: 84 U/L (ref 38–126)
Anion gap: 8 (ref 5–15)
BUN: 9 mg/dL (ref 6–20)
CO2: 26 mmol/L (ref 22–32)
CREATININE: 0.53 mg/dL (ref 0.44–1.00)
Calcium: 9.3 mg/dL (ref 8.9–10.3)
Chloride: 106 mmol/L (ref 101–111)
GFR calc Af Amer: >60 mL/min (ref >60)
Glucose, Bld: 90 mg/dL (ref 65–99)
Potassium: 3.9 mmol/L (ref 3.5–5.1)
SODIUM: 140 mmol/L (ref 135–145)
Total Bilirubin: 0.2 mg/dL — ABNORMAL LOW (ref 0.3–1.2)
Total Protein: 7 g/dL (ref 6.5–8.1)

## 2014-08-05 LAB — PROTEIN / CREATININE RATIO, URINE: Creatinine, Urine: 33 mg/dL

## 2014-08-05 LAB — CBC
HEMATOCRIT: 37.9 % (ref 36.0–46.0)
Hemoglobin: 13.1 g/dL (ref 12.0–15.0)
MCH: 30.1 pg (ref 26.0–34.0)
MCHC: 34.6 g/dL (ref 30.0–36.0)
MCV: 87.1 fL (ref 78.0–100.0)
Platelets: 206 10*3/uL (ref 150–400)
RBC: 4.35 MIL/uL (ref 3.87–5.11)
RDW: 13.3 % (ref 11.5–15.5)
WBC: 10.6 10*3/uL — AB (ref 4.0–10.5)

## 2014-08-05 MED ORDER — IBUPROFEN 600 MG PO TABS: 600.0000 mg | ORAL_TABLET | Freq: Four times a day (QID) | ORAL | Status: DC | PRN

## 2014-08-05 NOTE — Discharge Instructions (Signed)

## 2014-08-05 NOTE — MAU Provider Note (Signed)
Chief Complaint: Hypertension   First Provider Initiated Contact with Patient 08/05/14 1704      SUBJECTIVE HPI: Lindsey Mcbride is a 34 y.o. [id number] status post, gated vaginal delivery 07/[birth year]/2014 who presents to Maternity Admissions reporting elevated blood pressure per home health nurse (145/90, 135/80). Also having occasional mild to moderate headaches since delivery that resolve with ibuprofen and bilateral pedal edema since delivery that is improving. Denies epigastric pain or visual changes. No elevated blood pressure before during pregnancy.  No headache now. When she has a headache it is generalized, throbbing. No difficulties with speech or gait.  Past Medical History  Diagnosis Date  . Medical history non-contributory   . Headache    OB History  Gravida Para Term Preterm AB SAB TAB Ectopic Multiple Living  0 1    # Outcome Date GA Lbr Len/2nd Weight Sex Delivery Anes PTL Lv  3 Term 07/21/14 [redacted]w[redacted]d 06:34 / 00:35 7 lb 8.3 oz (3.41 kg) F Vag-Spont Local  Y  2 SAB           1 SAB              Past Surgical History  Procedure Laterality Date  . No past surgeries     History   Social History  . Marital Status: Single    Spouse Name: N/A  . Number of Children: N/A  . Years of Education: N/A   Occupational History  . Not on file.   Social History Main Topics  . Smoking status: Never Smoker   . Smokeless tobacco: Never Used  . Alcohol Use: No  . Drug Use: No  . Sexual Activity: Yes    Birth Control/ Protection: None   Other Topics Concern  . Not on file   Social History Narrative   No current facility-administered medications on file prior to encounter.   Current Outpatient Prescriptions on File Prior to Encounter  Medication Sig Dispense Refill  . Prenatal Vit-Fe Fumarate-FA (PRENATAL MULTIVITAMIN) TABS tablet Take 1 tablet by mouth at bedtime.    . docusate sodium (COLACE) 100 MG capsule Take 1 capsule (100 mg total) by mouth 2 (two) times  daily. (Patient not taking: Reported on 08/05/2014) 10 capsule 0   No Known Allergies  Review of Systems  Constitutional: Negative for fever.  Eyes: Negative for blurred vision.  Gastrointestinal: Negative for abdominal pain.  Musculoskeletal: Negative for neck pain.  Neurological: Positive for headaches. Negative for speech change, focal weakness and seizures.    OBJECTIVE Blood pressure 135/82, pulse 70, temperature 98.2 F (36.8 C), temperature source Oral, resp. rate 18, last menstrual period 10/09/2013, not currently breastfeeding.  Patient Vitals for the past 24 hrs:  BP Temp Temp src Pulse Resp  08/05/14 1736 138/84 mmHg - - 66 18  08/05/14 1628 135/82 mmHg - - 70 -  08/05/14 1404 131/82 mmHg 98.2 F (36.8 C) Oral 78 18    GENERAL: Well-developed, well-nourished female in no acute distress.  HEART: normal rate RESP: normal effort GI: Abdomen soft, no epigastric tenderness. MS: Nontender, 2+ pitting bilateral pedal edema NEURO: Alert and oriented. Deep tendon reflexes 2+, no clonus. SPECULUM EXAM: Deferred  LAB RESULTS Results for orders placed or performed during the hospital encounter of 08/05/14 (from the past 24 hour(s))  Protein / creatinine ratio, urine     Status: None   Collection Time: 08/05/14  3:10 PM  Result Value Ref Range   Creatinine, Urine  33.00 mg/dL   Total Protein, Urine <6 mg/dL   Protein Creatinine Ratio  Below reportable range      0.00 - 0.15 mg/mg[Cre]  Urinalysis, Routine w reflex microscopic ([facility])     Status: Abnormal   Collection Time: 08/05/14  3:10 PM  Result Value Ref Range   Color, Urine YELLOW YELLOW   APPearance HAZY (A) CLEAR   Specific Gravity, Urine 1.010 1.005 - 1.030   pH 5.0 5.0 - 8.0   Glucose, UA NEGATIVE NEGATIVE mg/dL   Hgb urine dipstick SMALL (A) NEGATIVE   Bilirubin Urine NEGATIVE NEGATIVE   Ketones, ur NEGATIVE NEGATIVE mg/dL   Protein, ur NEGATIVE NEGATIVE mg/dL   Urobilinogen, UA 0.2 0.0 - 1.0 mg/dL    Nitrite NEGATIVE NEGATIVE   Leukocytes, UA LARGE (A) NEGATIVE  Urine microscopic-add on     Status: Abnormal   Collection Time: 08/05/14  3:10 PM  Result Value Ref Range   Squamous Epithelial / LPF FEW (A) RARE   WBC, UA 21-50 <3 WBC/hpf   RBC / HPF 0-2 <3 RBC/hpf  Comprehensive metabolic panel     Status: Abnormal   Collection Time: 08/05/14  3:39 PM  Result Value Ref Range   Sodium 140 135 - 145 mmol/L   Potassium 3.9 3.5 - 5.1 mmol/L   Chloride 106 101 - 111 mmol/L   CO2 26 22 - 32 mmol/L   Glucose, Bld 90 65 - 99 mg/dL   BUN 9 6 - 20 mg/dL   Creatinine, Ser 1.610.53 0.44 - 1.00 mg/dL   Calcium 9.3 8.9 - 09.610.3 mg/dL   Total Protein 7.0 6.5 - 8.1 g/dL   Albumin 3.4 (L) 3.5 - 5.0 g/dL   AST 37 15 - 41 U/L   ALT 38 14 - 54 U/L   Alkaline Phosphatase 84 38 - 126 U/L   Total Bilirubin 0.2 (L) 0.3 - 1.2 mg/dL   GFR calc non Af Amer >60 >60 mL/min   GFR calc Af Amer >60 >60 mL/min   Anion gap 8 5 - 15  CBC     Status: Abnormal   Collection Time: 08/05/14  3:39 PM  Result Value Ref Range   WBC 10.6 (H) 4.0 - 10.5 K/uL   RBC 4.35 3.87 - 5.11 MIL/uL   Hemoglobin 13.1 12.0 - 15.0 g/dL   HCT 04.537.9 40.936.0 - 81.146.0 %   MCV 87.1 78.0 - 100.0 fL   MCH 30.1 26.0 - 34.0 pg   MCHC 34.6 30.0 - 36.0 g/dL   RDW 91.413.3 78.211.5 - 95.615.5 %   Platelets 206 150 - 400 K/uL    IMAGING N/A  MAU COURSE  ASSESSMENT 1. Gestational hypertension without significant proteinuria, postpartum     PLAN Discharge home in stable condition. Preeclampsia precautions. Consider stopping ibuprofen due to possible worsening of hypertension. May use Tylenol and comfort measures for headache as needed.     Follow-up Information    Follow up with [facility] [place].   Why:  For postpartum visit   Contact information:   [address] [place] [phone]      [facility].   Why:  As needed in emergencies   Contact  information:   [address] [id number] [place] [id number] [phone]       Medication List    STOP taking these medications        docusate sodium  100 MG capsule  Commonly known as:  COLACE      TAKE these medications        Fish Oil 1000 MG Caps  Take 1 capsule by mouth daily.     ibuprofen 600 MG tablet  Commonly known as:  ADVIL,MOTRIN  Take 1 tablet (600 mg total) by mouth every 6 (six) hours as needed for headache, mild pain or moderate pain.     prenatal multivitamin Tabs tablet  Take 1 tablet by mouth at bedtime.         [place], [place] 08/05/2014  5:31 PM

## 2014-08-05 NOTE — MAU Note (Signed)
Home nurse referred to MAU for BP checks. BP increased at home yesterday and last week. Has had a HA since May 22nd. Treats with ibuprofen and gets relief. Bilateral foot edema. Denies any other complaints

## 2014-08-05 NOTE — Progress Notes (Signed)
Pt presented for BP check. She reports a constant headache and feet swelling. BP today is 140/70. I discussed patient with Dr. Jolayne Pantheronstant and she recommended that patient go to MAU for evaluation. Report called to MAU and patient escorted.

## 2014-08-05 NOTE — MAU Note (Signed)
Headache gone now, taking ibuprofen for HA only.

## 2014-08-05 NOTE — MAU Note (Signed)
Vag del on 5/18.  Uncomplicated preg.  Home nurse checked her and BP was up, first time was 145/90, yesterday 135/80.  Has had daily headaches since she got home, denies visual changes or epigastric pain.  Some increase in swelling in lower extr, better today.

## 2016-11-15 ENCOUNTER — Encounter (HOSPITAL_COMMUNITY): Payer: Self-pay | Admitting: Emergency Medicine

## 2016-11-15 ENCOUNTER — Encounter (HOSPITAL_COMMUNITY): Payer: Self-pay | Admitting: *Deleted

## 2016-11-15 ENCOUNTER — Emergency Department (HOSPITAL_COMMUNITY)
Admission: EM | Admit: 2016-11-15 | Discharge: 2016-11-15 | Disposition: A | Discharge status: Home / Self Care | Payer: Medicaid Other | Attending: Emergency Medicine | Admitting: Emergency Medicine

## 2016-11-15 ENCOUNTER — Emergency Department (HOSPITAL_COMMUNITY): Payer: Medicaid Other

## 2016-11-15 ENCOUNTER — Ambulatory Visit (HOSPITAL_COMMUNITY)
Admission: EM | Admit: 2016-11-15 | Discharge: 2016-11-15 | Disposition: A | Discharge status: Home / Self Care | Payer: Medicaid Other | Attending: Family Medicine | Admitting: Family Medicine

## 2016-11-15 DIAGNOSIS — G43A1 Cyclical vomiting, intractable: Secondary | ICD-10-CM | POA: Diagnosis not present

## 2016-11-15 DIAGNOSIS — R519 Headache, unspecified: Secondary | ICD-10-CM

## 2016-11-15 DIAGNOSIS — G44209 Tension-type headache, unspecified, not intractable: Secondary | ICD-10-CM | POA: Diagnosis not present

## 2016-11-15 DIAGNOSIS — R1115 Cyclical vomiting syndrome unrelated to migraine: Secondary | ICD-10-CM

## 2016-11-15 DIAGNOSIS — F5101 Primary insomnia: Secondary | ICD-10-CM

## 2016-11-15 DIAGNOSIS — R51 Headache: Secondary | ICD-10-CM | POA: Diagnosis not present

## 2016-11-15 HISTORY — DX: Personal history of other diseases of the nervous system and sense organs: Z86.69

## 2016-11-15 LAB — CBC WITH DIFFERENTIAL/PLATELET
BASOS ABS: 0 10*3/uL (ref 0.0–0.1)
BASOS PCT: 1 %
Eosinophils Absolute: 0 10*3/uL (ref 0.0–0.7)
Eosinophils Relative: 1 %
HCT: 42.4 % (ref 36.0–46.0)
Hemoglobin: 13.8 g/dL (ref 12.0–15.0)
Lymphocytes Relative: 15 %
Lymphs Abs: 0.9 10*3/uL (ref 0.7–4.0)
MCH: 28.6 pg (ref 26.0–34.0)
MCHC: 32.5 g/dL (ref 30.0–36.0)
MCV: 87.8 fL (ref 78.0–100.0)
Monocytes Absolute: 0.3 10*3/uL (ref 0.1–1.0)
Monocytes Relative: 6 %
NEUTROS ABS: 4.6 10*3/uL (ref 1.7–7.7)
Neutrophils Relative %: 79 %
PLATELETS: 196 10*3/uL (ref 150–400)
RBC: 4.83 MIL/uL (ref 3.87–5.11)
RDW: 12.3 % (ref 11.5–15.5)
WBC: 5.8 10*3/uL (ref 4.0–10.5)

## 2016-11-15 LAB — BASIC METABOLIC PANEL
ANION GAP: 8 (ref 5–15)
BUN: 13 mg/dL (ref 6–20)
CALCIUM: 8.6 mg/dL — AB (ref 8.9–10.3)
CO2: 24 mmol/L (ref 22–32)
Chloride: 105 mmol/L (ref 101–111)
Creatinine, Ser: 0.61 mg/dL (ref 0.44–1.00)
GFR calc non Af Amer: >60 mL/min (ref >60)
GLUCOSE: 81 mg/dL (ref 65–99)
Potassium: 3.1 mmol/L — ABNORMAL LOW (ref 3.5–5.1)
Sodium: 137 mmol/L (ref 135–145)

## 2016-11-15 LAB — I-STAT BETA HCG BLOOD, ED (MC, WL, AP ONLY): I-stat hCG, quantitative: <5 m[IU]/mL (ref <5)

## 2016-11-15 LAB — TSH: TSH: 0.727 u[IU]/mL (ref 0.350–4.500)

## 2016-11-15 MED ORDER — ONDANSETRON 4 MG PO TBDP
ORAL_TABLET | ORAL | Status: AC
  Filled 2016-11-15: qty 1

## 2016-11-15 MED ORDER — PROCHLORPERAZINE EDISYLATE 5 MG/ML IJ SOLN: 10.0000 mg | Freq: Once | INTRAMUSCULAR | Status: DC

## 2016-11-15 MED ORDER — PROCHLORPERAZINE EDISYLATE 5 MG/ML IJ SOLN
10.0000 mg | Freq: Once | INTRAMUSCULAR | Status: AC
Start: 2016-11-15 — End: 2016-11-15
  Administered 2016-11-15: 10 mg via INTRAVENOUS
  Filled 2016-11-15: qty 2

## 2016-11-15 MED ORDER — SODIUM CHLORIDE 0.9 % IV BOLUS (SEPSIS)
1000.0000 mL | Freq: Once | INTRAVENOUS | Status: AC
  Administered 2016-11-15: 1000 mL via INTRAVENOUS

## 2016-11-15 MED ORDER — ONDANSETRON HCL 4 MG PO TABS: 4.0000 mg | ORAL_TABLET | Freq: Three times a day (TID) | ORAL | 0 refills | Status: DC | PRN

## 2016-11-15 MED ORDER — DIPHENHYDRAMINE HCL 50 MG/ML IJ SOLN: 12.5000 mg | Freq: Once | INTRAMUSCULAR | Status: DC

## 2016-11-15 MED ORDER — DIPHENHYDRAMINE HCL 50 MG/ML IJ SOLN
12.5000 mg | Freq: Once | INTRAMUSCULAR | Status: AC
  Administered 2016-11-15: 12.5 mg via INTRAVENOUS
  Filled 2016-11-15: qty 1

## 2016-11-15 MED ORDER — POTASSIUM CHLORIDE CRYS ER 20 MEQ PO TBCR
40.0000 meq | EXTENDED_RELEASE_TABLET | Freq: Once | ORAL | Status: AC
  Administered 2016-11-15: 40 meq via ORAL
  Filled 2016-11-15: qty 2

## 2016-11-15 MED ORDER — ONDANSETRON 4 MG PO TBDP
4.0000 mg | ORAL_TABLET | Freq: Once | ORAL | Status: AC | PRN
  Administered 2016-11-15: 4 mg via ORAL

## 2016-11-15 NOTE — ED Triage Notes (Addendum)
To ED for eval of HA that is different in the past 24 hrs than the usual migraines she has. Pt states her usual migraine she can control with the foods she eats, known triggers, and advil. She is typically photophobic. If unable to catch the pain in time she states she will vomit and pain is instantly gone. Pt states over the past 24 hrs pts headache has been sharp, she is not photophobic, and she has been vomiting. Pain continues. Tearful in triage. Unable to take OTC meds due to vomiting. Denies injury.

## 2016-11-15 NOTE — Discharge Instructions (Signed)
Please read attached formation regarding your condition. Take Zofran as needed for nausea. Take melatonin 10 mg nightly to help with sleep. Follow-up and [facility] for further evaluation. Return to ED for severe headache, vision changes, head injury, loss of consciousness, increased vomiting.

## 2016-11-15 NOTE — ED Triage Notes (Addendum)
Headache and vomiting for the last 24 hours.  Patient says she has had difficulty sleeping for the past 2 weeks.  Says she is unable to shut her mind down to sleep.

## 2016-11-15 NOTE — Discharge Instructions (Signed)
You need further evaluation in the emergency department because of the duration and trend of the headaches in combination with recent insomnia and vomiting.

## 2016-11-15 NOTE — ED Provider Notes (Signed)
[facility] Provider Note   CSN: [id number] Arrival date & time: 11/15/16  1205     History   Chief Complaint Chief Complaint  Patient presents with  . Migraine    HPI Lindsey Mcbride is a 36 y.o. female.  HPI  Patient, with a past medical history of migraines, presents to ED for 24-hour history of headache. States that the headache is located like a band on her forehead and rates it as 6/10. She states that this feels different than her usual migraines because her usual migraines usually go away with Advil. States that sometimes they go away after one episode of vomiting with no medication use. She has had multiple episodes of vomiting. She has been unable to sleep well for the past 2 weeks. She denies any new stressors or anxiety in her life. She denies any head injury, falls or loss of consciousness. She has taken Advil with no relief in her symptoms. She denies any numbness, weakness, chest pain, abdominal pain, facial asymmetry, fevers, neck pain, blood thinner use.  Past Medical History:  Diagnosis Date  . Headache   . Hx of migraine headaches   . Medical history non-contributory     Patient Active Problem List   Diagnosis Date Noted  . Status post vaginal delivery 07/21/2014  . Oligohydramnios 07/20/2014    Past Surgical History:  Procedure Laterality Date  . NO PAST SURGERIES      OB History    Gravida Para Term Preterm AB Living   3 1 1   2 1    SAB TAB Ectopic Multiple Live Births   2     0 1       Home Medications    Prior to Admission medications   Medication Sig Start Date End Date Taking? Authorizing Provider  ibuprofen (ADVIL,MOTRIN) 600 MG tablet Take 1 tablet (600 mg total) by mouth every 6 (six) hours as needed for headache, mild pain or moderate pain. 08/05/14   Katrinka BlazingSmith, [place], CNM  Omega-3 Fatty Acids (FISH OIL) 1000 MG CAPS Take 1 capsule by mouth daily.    [provider]  ondansetron (ZOFRAN) 4 MG tablet Take 1 tablet (4 mg  total) by mouth every 8 (eight) hours as needed for nausea or vomiting. 11/15/16   Sammy Douthitt, Hillary BowHina, PA-C  Prenatal Vit-Fe Fumarate-FA (PRENATAL MULTIVITAMIN) TABS tablet Take 1 tablet by mouth at bedtime.    [provider]    Family History No family history on file.  Social History Social History  Substance Use Topics  . Smoking status: Never Smoker  . Smokeless tobacco: Never Used  . Alcohol use No     Allergies   Patient has no known allergies.   Review of Systems Review of Systems  Constitutional: Negative for appetite change, chills and fever.  HENT: Negative for ear pain, rhinorrhea, sneezing and sore throat.   Eyes: Positive for photophobia. Negative for visual disturbance.  Respiratory: Negative for cough, chest tightness, shortness of breath and wheezing.   Cardiovascular: Negative for chest pain and palpitations.  Gastrointestinal: Positive for nausea and vomiting. Negative for abdominal pain, blood in stool, constipation and diarrhea.  Genitourinary: Negative for dysuria, hematuria and urgency.  Musculoskeletal: Negative for myalgias.  Skin: Negative for rash.  Neurological: Positive for headaches. Negative for dizziness, weakness and light-headedness.  Psychiatric/Behavioral: Positive for sleep disturbance. Negative for agitation, confusion and hallucinations. The patient is not nervous/anxious.      Physical Exam Updated Vital Signs BP (!) 135/100 (BP  Location: Left Arm)   Pulse 83   Temp 98.1 F (36.7 C) (Oral)   Resp 16   LMP 11/15/2016   SpO2 100%   Physical Exam  Constitutional: She is oriented to person, place, and time. She appears well-developed and well-nourished. No distress.  Nontoxic appearing and in no acute distress. Patient appears comfortable. Speaking in full sentences.  HENT:  Head: Normocephalic and atraumatic.  Nose: Nose normal.  Eyes: Pupils are equal, round, and reactive to light. Conjunctivae and EOM are normal. Right eye  exhibits no discharge. Left eye exhibits no discharge. No scleral icterus.  Neck: Normal range of motion. Neck supple.  Cardiovascular: Normal rate, regular rhythm, normal heart sounds and intact distal pulses.  Exam reveals no gallop and no friction rub.   No murmur heard. Pulmonary/Chest: Effort normal and breath sounds normal. No respiratory distress.  Abdominal: Soft. Bowel sounds are normal. She exhibits no distension. There is no tenderness. There is no guarding.  Musculoskeletal: Normal range of motion. She exhibits no edema.  Neurological: She is alert and oriented to person, place, and time. No cranial nerve deficit or sensory deficit. She exhibits normal muscle tone. Coordination normal.  Pupils reactive. No facial asymmetry noted. Cranial nerves appear grossly intact. Sensation intact to light touch on face, BUE and BLE. Strength 5/5 in BUE and BLE. Normal patellar reflexes bilaterally. Normal finger to nose coordination.  Skin: Skin is warm and dry. No rash noted.  Psychiatric: She has a normal mood and affect.  Nursing note and vitals reviewed.    ED Treatments / Results  Labs (all labs ordered are listed, but only abnormal results are displayed) Labs Reviewed  BASIC METABOLIC PANEL - Abnormal; Notable for the following:       Result Value   Potassium 3.1 (*)    Calcium 8.6 (*)    All other components within normal limits  CBC WITH DIFFERENTIAL/PLATELET  TSH  I-STAT BETA HCG BLOOD, ED ([facility], [facility], [facility] ONLY)    EKG  EKG Interpretation None       Radiology Ct Head Wo Contrast  Result Date: 11/15/2016 CLINICAL DATA:  Pt reports episodes of severe HA followed by vomit x 1 month, 2 weeks ago and yesterday and today it has been non-stop. EXAM: CT HEAD WITHOUT CONTRAST TECHNIQUE: Contiguous axial images were obtained from the base of the skull through the vertex without intravenous contrast. COMPARISON:  None. FINDINGS: Brain: No evidence of acute infarction, hemorrhage,  hydrocephalus, extra-axial collection or mass lesion/mass effect. Vascular: No hyperdense vessel or unexpected calcification. Skull: Normal. Negative for fracture or focal lesion. Sinuses/Orbits: The globes orbits are unremarkable. Visualized sinuses and mastoid air cells are clear P Other: None. IMPRESSION: Normal unenhanced CT scan of the brain. Electronically Signed   By: Amie Portland M.D.   On: 11/15/2016 17:39    Procedures Procedures (including critical care time)  Medications Ordered in ED Medications  ondansetron (ZOFRAN-ODT) 4 MG disintegrating tablet (not administered)  ondansetron (ZOFRAN-ODT) disintegrating tablet 4 mg (4 mg Oral Given 11/15/16 1223)  sodium chloride 0.9 % bolus 1,000 mL (1,000 mLs Intravenous New Bag/Given 11/15/16 1703)  potassium chloride SA (K-DUR,KLOR-CON) CR tablet 40 mEq (40 mEq Oral Given 11/15/16 1834)  prochlorperazine (COMPAZINE) injection 10 mg (10 mg Intravenous Given 11/15/16 1827)  diphenhydrAMINE (BENADRYL) injection 12.5 mg (12.5 mg Intravenous Given 11/15/16 1825)     Initial Impression / Assessment and Plan / ED Course  I have reviewed the triage vital signs and the  nursing notes.  Pertinent labs & imaging results that were available during my care of the patient were reviewed by me and considered in my medical decision making (see chart for details).     Patient presents to ED for evaluation of headache for the past 24 hours, insomnia for 2 weeks. She does have a history of migraines but she states that this headache feels different than her previous migraines. States that her usual migraines go away with Advil. She is continuing to have vomiting several episodes today. She was sent here for further evaluation of the severity and intensity of her headache. There are no focal deficits on neurological exam. She is afebrile with no history of fever. She has no neck pain or trouble moving neck. Cranial nerves appear grossly intact. She denies any falls  or head injuries. CBC unremarkable. HCG negative. BMP shows mild hypokalemia at 3.1. TSH normal. Blood pressure is elevated here in the ED. CT of head returned as normal. Patient reports much improvement in headache with Compazine and Benadryl. Potassium repleted orally here in the ED. BP improved. I have low suspicion for infectious cause of her headache or other acute intracranial abnormality for her headache, based on today's labwork and imaging. Patient encouraged to take Zofran as needed for nausea and to take melatonin to help with insomnia. Encouraged her to follow up at [facility] and wellness for further evaluation. Strict return precautions given.  Final Clinical Impressions(s) / ED Diagnoses   Final diagnoses:  Acute non intractable tension-type headache    New Prescriptions New Prescriptions   ONDANSETRON (ZOFRAN) 4 MG TABLET    Take 1 tablet (4 mg total) by mouth every 8 (eight) hours as needed for nausea or vomiting.     Dietrich Pates, PA-C 11/15/16 1959    Dietrich Pates, PA-C 11/15/16 Andy Gauss, MD 11/16/16 0010

## 2016-11-15 NOTE — ED Provider Notes (Signed)
[facility]    CSN: [id number] Arrival date & time: 11/15/16  1036     History   Chief Complaint Chief Complaint  Patient presents with  . Headache    HPI Lindsey Mcbride is a 36 y.o. female.   Headache and vomiting for the last 24 hours.  Patient says she has had difficulty sleeping for the past 2 weeks.  Says she is unable to shut her mind down to sleep.  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 The headaches have become much worse over the last 24 hours. Headaches are non-positional.  Patient's had no loss of appetite but she has had weight loss. She denies any new emotional issues. She's had no abdominal pain. There's been no diplopia or change in hearing.      Past Medical History:  Diagnosis Date  . Headache   . Medical history non-contributory     Patient Active Problem List   Diagnosis Date Noted  . Status post vaginal delivery 07/21/2014  . Oligohydramnios 07/20/2014    Past Surgical History:  Procedure Laterality Date  . NO PAST SURGERIES      OB History    Gravida Para Term Preterm AB Living   3 1 1   2 1    SAB TAB Ectopic Multiple Live Births   2     0 1       Home Medications    Prior to Admission medications   Medication Sig Start Date End Date Taking? Authorizing Provider  ibuprofen (ADVIL,MOTRIN) 600 MG tablet Take 1 tablet (600 mg total) by mouth every 6 (six) hours as needed for headache, mild pain or moderate pain. 08/05/14  Yes Smith, [place], CNM  Omega-3 Fatty Acids (FISH OIL) 1000 MG CAPS Take 1 capsule by mouth daily.    [provider]  Prenatal Vit-Fe Fumarate-FA (PRENATAL MULTIVITAMIN) TABS tablet Take 1 tablet by mouth at bedtime.    [provider]    Family History No family history on file.  Social History Social History  Substance Use Topics  . Smoking status: Never Smoker  . Smokeless tobacco: Never Used  . Alcohol use No     Allergies    Patient has no known allergies.   Review of Systems Review of Systems  Constitutional: Positive for unexpected weight change. Negative for diaphoresis and fever.  HENT: Negative.   Gastrointestinal: Positive for nausea and vomiting. Negative for diarrhea.  Neurological: Positive for headaches.     Physical Exam Triage Vital Signs ED Triage Vitals  Enc Vitals Group     BP 11/15/16 1132 129/86     Pulse Rate 11/15/16 1132 79     Resp 11/15/16 1132 18     Temp 11/15/16 1132 97.8 F (36.6 C)     Temp Source 11/15/16 1132 Oral     SpO2 11/15/16 1132 100 %     Weight --      Height --      Head Circumference --      Peak Flow --      Pain Score 11/15/16 1129 6     Pain Loc --      Pain Edu? --      Excl. in GC? --    No data found.   Updated Vital Signs BP 129/86 (BP Location: Left Arm)   Pulse 79   Temp 97.8 F (36.6 C) (Oral)   Resp 18   LMP 11/15/2016  SpO2 100%   Visual Acuity Right Eye Distance:   Left Eye Distance:   Bilateral Distance:    Right Eye Near:   Left Eye Near:    Bilateral Near:     Physical Exam  Constitutional: She is oriented to person, place, and time. She appears well-developed and well-nourished.  HENT:  Head: Normocephalic.  Right Ear: External ear normal.  Left Ear: External ear normal.  Mouth/Throat: Oropharynx is clear and moist.  Normal tympanic membranes  Eyes: Pupils are equal, round, and reactive to light. Conjunctivae and EOM are normal.  Normal fundi  Neck: Normal range of motion. Neck supple.  Cardiovascular: Normal rate, regular rhythm and normal heart sounds.   Pulmonary/Chest: Effort normal and breath sounds normal.  Abdominal: Soft. She exhibits no mass. There is no tenderness.  Musculoskeletal: Normal range of motion.  Neurological: She is alert and oriented to person, place, and time. No cranial nerve deficit.  Skin: Skin is warm and dry.  Nursing note and vitals reviewed.    UC Treatments / Results   Labs (all labs ordered are listed, but only abnormal results are displayed) Labs Reviewed - No data to display  EKG  EKG Interpretation None       Radiology No results found.  Procedures Procedures (including critical care time)  Medications Ordered in UC Medications - No data to display   Initial Impression / Assessment and Plan / UC Course  I have reviewed the triage vital signs and the nursing notes.  Pertinent labs & imaging results that were available during my care of the patient were reviewed by me and considered in my medical decision making (see chart for details).     Final Clinical Impressions(s) / UC Diagnoses   Final diagnoses:  Bad headache  Intractable cyclical vomiting with nausea  Primary insomnia    New Prescriptions New Prescriptions   No medications on file   You need further evaluation in the emergency department because of the duration and trend of the headaches in combination with recent insomnia and vomiting.  Controlled Substance Prescriptions [place] Controlled Substance Registry consulted? Not Applicable   Elvina Sidle, MD 11/15/16 1155

## 2016-11-15 NOTE — ED Notes (Signed)
Patient transported to ct

## 2017-06-08 ENCOUNTER — Encounter (HOSPITAL_COMMUNITY): Payer: Self-pay | Admitting: *Deleted

## 2017-06-08 ENCOUNTER — Inpatient Hospital Stay (HOSPITAL_COMMUNITY)
Admission: AD | Admit: 2017-06-08 | Discharge: 2017-06-08 | Disposition: A | Discharge status: Home / Self Care | Payer: Medicaid Other | Source: Ambulatory Visit | Attending: Obstetrics and Gynecology | Admitting: Obstetrics and Gynecology

## 2017-06-08 ENCOUNTER — Inpatient Hospital Stay (HOSPITAL_COMMUNITY): Payer: Medicaid Other

## 2017-06-08 DIAGNOSIS — Z3A08 8 weeks gestation of pregnancy: Secondary | ICD-10-CM | POA: Diagnosis not present

## 2017-06-08 DIAGNOSIS — R109 Unspecified abdominal pain: Secondary | ICD-10-CM

## 2017-06-08 DIAGNOSIS — O021 Missed abortion: Secondary | ICD-10-CM | POA: Diagnosis not present

## 2017-06-08 DIAGNOSIS — O209 Hemorrhage in early pregnancy, unspecified: Secondary | ICD-10-CM | POA: Diagnosis present

## 2017-06-08 DIAGNOSIS — O26899 Other specified pregnancy related conditions, unspecified trimester: Secondary | ICD-10-CM

## 2017-06-08 LAB — URINALYSIS, ROUTINE W REFLEX MICROSCOPIC
Bilirubin Urine: NEGATIVE
Glucose, UA: NEGATIVE mg/dL
Hgb urine dipstick: NEGATIVE
Ketones, ur: NEGATIVE mg/dL
LEUKOCYTES UA: NEGATIVE
NITRITE: NEGATIVE
PROTEIN: NEGATIVE mg/dL
Specific Gravity, Urine: 1.02 (ref 1.005–1.030)
pH: 5 (ref 5.0–8.0)

## 2017-06-08 LAB — HCG, QUANTITATIVE, PREGNANCY: hCG, Beta Chain, Quant, S: 52010 m[IU]/mL — ABNORMAL HIGH (ref <5)

## 2017-06-08 LAB — CBC
HCT: 35.8 % — ABNORMAL LOW (ref 36.0–46.0)
Hemoglobin: 12.5 g/dL (ref 12.0–15.0)
MCH: 29.7 pg (ref 26.0–34.0)
MCHC: 34.9 g/dL (ref 30.0–36.0)
MCV: 85 fL (ref 78.0–100.0)
PLATELETS: 179 10*3/uL (ref 150–400)
RBC: 4.21 MIL/uL (ref 3.87–5.11)
RDW: 12.2 % (ref 11.5–15.5)
WBC: 8.7 10*3/uL (ref 4.0–10.5)

## 2017-06-08 LAB — WET PREP, GENITAL
Clue Cells Wet Prep HPF POC: NONE SEEN
SPERM: NONE SEEN
TRICH WET PREP: NONE SEEN
YEAST WET PREP: NONE SEEN

## 2017-06-08 LAB — POCT PREGNANCY, URINE: Preg Test, Ur: POSITIVE — AB

## 2017-06-08 NOTE — MAU Note (Signed)
Pt reports positive preg test one month ago, reports lower abd cramping and brownish discharge.

## 2017-06-08 NOTE — MAU Provider Note (Signed)
History     CSN: [id number]  Arrival date and time: 06/08/17 1244   First Provider Initiated Contact with Patient 06/08/17 1412      Chief Complaint  Patient presents with  . Abdominal Pain  . Vaginal Discharge  . Possible Pregnancy   HPI Lindsey Mcbride is a 37 y.o. G3P1011 at [redacted]w[redacted]d who presents with abdominal cramping and vaginal bleeding. Reports positive pregnancy test at home last month. LMP was 03/30/17. She has not been seen anywhere for this pregnancy. Reports episode of abdominal pain last week that resolved. Pain returned last night. Describes as intermittent sharp lower abdominal cramping. Last felt pain this morning. Currently no pain. Since last night has had brown spotting on toilet paper. No bright red blood. Not saturating pads or passing clots. Denies n/v/d, dysuria, fever/chills. No recent intercourse. Hx of 11 wk SAB with D&C 5 years ago.   OB History    Gravida  3   Para  1   Term  1   Preterm      AB  1   Living  1     SAB  1   TAB      Ectopic      Multiple  0   Live Births  1           Past Medical History:  Diagnosis Date  . Headache   . Hx of migraine headaches   . Medical history non-contributory     Past Surgical History:  Procedure Laterality Date  . NO PAST SURGERIES      History reviewed. No pertinent family history.  Social History   Tobacco Use  . Smoking status: Never Smoker  . Smokeless tobacco: Never Used  Substance Use Topics  . Alcohol use: No  . Drug use: No    Allergies: No Known Allergies  Medications Prior to Admission  Medication Sig Dispense Refill Last Dose  . ibuprofen (ADVIL,MOTRIN) 600 MG tablet Take 1 tablet (600 mg total) by mouth every 6 (six) hours as needed for headache, mild pain or moderate pain. 30 tablet 0 11/14/2016 at Unknown time  . Omega-3 Fatty Acids (FISH OIL) 1000 MG CAPS Take 1 capsule by mouth daily.   08/05/2014 at Unknown time  . ondansetron (ZOFRAN) 4 MG tablet Take 1 tablet (4  mg total) by mouth every 8 (eight) hours as needed for nausea or vomiting. 12 tablet 0   . Prenatal Vit-Fe Fumarate-FA (PRENATAL MULTIVITAMIN) TABS tablet Take 1 tablet by mouth at bedtime.   08/05/2014 at Unknown time    Review of Systems  Constitutional: Negative.   Gastrointestinal: Positive for abdominal pain. Negative for constipation, diarrhea, nausea and vomiting.  Genitourinary: Positive for vaginal bleeding and vaginal discharge. Negative for dysuria.   Physical Exam   Blood pressure 118/71, pulse 72, temperature 98.9 F (37.2 C), temperature source Oral, resp. rate 16, height 5\' 4"  (1.626 m), weight 123 lb (55.8 kg), last menstrual period 03/30/2017, SpO2 100 %.  Physical Exam  Nursing note and vitals reviewed. Constitutional: She is oriented to person, place, and time. She appears well-developed and well-nourished. No distress.  HENT:  Head: Normocephalic and atraumatic.  Eyes: Conjunctivae are normal. Right eye exhibits no discharge. Left eye exhibits no discharge. No scleral icterus.  Neck: Normal range of motion.  Respiratory: Effort normal. No respiratory distress.  GI: Soft. There is no tenderness.  Genitourinary: Uterus normal. Cervix exhibits no motion tenderness and no friability. Right adnexum displays no mass and  no tenderness. Left adnexum displays no mass and no tenderness. No bleeding in the vagina.  Genitourinary Comments: Minimal amount of tan mucous. No bleeding. Cervix closed.   Neurological: She is alert and oriented to person, place, and time.  Skin: Skin is warm and dry. She is not diaphoretic.  Psychiatric: She has a normal mood and affect. Her behavior is normal. Judgment and thought content normal.    MAU Course  Procedures Results for orders placed or performed during the hospital encounter of 06/08/17 (from the past 24 hour(s))  Urinalysis, Routine w reflex microscopic     Status: None   Collection Time: 06/08/17 12:57 PM  Result Value Ref Range    Color, Urine YELLOW YELLOW   APPearance CLEAR CLEAR   Specific Gravity, Urine 1.020 1.005 - 1.030   pH 5.0 5.0 - 8.0   Glucose, UA NEGATIVE NEGATIVE mg/dL   Hgb urine dipstick NEGATIVE NEGATIVE   Bilirubin Urine NEGATIVE NEGATIVE   Ketones, ur NEGATIVE NEGATIVE mg/dL   Protein, ur NEGATIVE NEGATIVE mg/dL   Nitrite NEGATIVE NEGATIVE   Leukocytes, UA NEGATIVE NEGATIVE  Pregnancy, urine POC     Status: Abnormal   Collection Time: 06/08/17  1:12 PM  Result Value Ref Range   Preg Test, Ur POSITIVE (A) NEGATIVE  Wet prep, genital     Status: Abnormal   Collection Time: 06/08/17  1:43 PM  Result Value Ref Range   Yeast Wet Prep HPF POC NONE SEEN NONE SEEN   Trich, Wet Prep NONE SEEN NONE SEEN   Clue Cells Wet Prep HPF POC NONE SEEN NONE SEEN   WBC, Wet Prep HPF POC FEW (A) NONE SEEN   Sperm NONE SEEN   CBC     Status: Abnormal   Collection Time: 06/08/17  2:06 PM  Result Value Ref Range   WBC 8.7 4.0 - 10.5 K/uL   RBC 4.21 3.87 - 5.11 MIL/uL   Hemoglobin 12.5 12.0 - 15.0 g/dL   HCT 82.935.8 (L) 56.236.0 - 13.046.0 %   MCV 85.0 78.0 - 100.0 fL   MCH 29.7 26.0 - 34.0 pg   MCHC 34.9 30.0 - 36.0 g/dL   RDW 86.512.2 78.411.5 - 69.615.5 %   Platelets 179 150 - 400 K/uL  hCG, quantitative, pregnancy     Status: Abnormal   Collection Time: 06/08/17  2:06 PM  Result Value Ref Range   hCG, Beta Chain, Quant, S 52,010 (H) <5 mIU/mL   [place] Ob Less Than 14 Weeks With Ob Transvaginal  Result Date: 06/08/2017 CLINICAL DATA:  Cramping and vaginal discharge. Positive pregnancy test. EXAM: OBSTETRIC <14 WK US AND TRANSVAGINAL OB US TECHNIQUE: Both transabdominal and transvaginal ultrasound examinations were performed for complete evaluation of the gestation as well as the maternal uterus, adnexal regions, and pelvic cul-de-sac. Transvaginal technique was performed to assess early pregnancy. COMPARISON:  None. FINDINGS: Intrauterine gestational sac: Single Yolk sac:  Possible collapsed yolk sac identified. Embryo:  Visualized.  Cardiac Activity: Not visualized. Heart Rate: N/A  bpm CRL: 19.8 mm   8 w   3 d                  US EDC: Subchorionic hemorrhage:  None visualized. Maternal uterus/adnexae: Unremarkable. Corpus luteum cyst noted right ovary. No free fluid in the cul-de-sac. IMPRESSION: Sonographic features compatible with intrauterine fetal demise (nonviable pregnancy) at estimated 8 week 3 day gestational age by crown-rump length. I personally called the results of this study to the patient's  provider, Denny Peon, at 1500 hours on 06/08/2017. Electronically Signed   By: Kennith Center M.D.   On: 06/08/2017 15:00     MDM +UPT UA, wet prep, GC/chlamydia, CBC, ABO/Rh, quant hCG, HIV, and [place] today to rule out ectopic pregnancy O positive Hemoglobin stable at 12.5 Ultrasound shows SIUP measuring [redacted]w[redacted]d with no cardiac activity, definitive for failed pregnancy  Discussed options for management of incomplete AB including expectant management, Cytotec or D&C. Prefers D&C at this time. Discussed that this could be scheduled outpatient as she is stable at this time. Patient understands. Informed of reasons to come back to MAU if condition changes prior to her surgery.    Assessment and Plan  A:  1. Missed abortion   2. Abdominal pain affecting pregnancy   3. [redacted] weeks gestation of pregnancy    P: Discharge home Pelvic rest Discussed reasons to return to MAU Msg to [place] to schedule D&C GC/CT pending  Judeth Horn 06/08/2017, 2:12 PM

## 2017-06-08 NOTE — Discharge Instructions (Signed)

## 2017-06-10 ENCOUNTER — Other Ambulatory Visit: Payer: Self-pay | Admitting: Family Medicine

## 2017-06-10 ENCOUNTER — Encounter (HOSPITAL_COMMUNITY): Payer: Self-pay | Admitting: *Deleted

## 2017-06-10 ENCOUNTER — Other Ambulatory Visit: Payer: Self-pay

## 2017-06-10 LAB — GC/CHLAMYDIA PROBE AMP (~~LOC~~) NOT AT ARMC
Chlamydia: NEGATIVE
NEISSERIA GONORRHEA: NEGATIVE

## 2017-06-10 MED ORDER — DOXYCYCLINE HYCLATE 100 MG IV SOLR: 200.0000 mg | Freq: Once | INTRAVENOUS | Status: DC

## 2017-06-11 ENCOUNTER — Encounter (HOSPITAL_COMMUNITY): Payer: Self-pay | Admitting: Emergency Medicine

## 2017-06-11 ENCOUNTER — Encounter (HOSPITAL_COMMUNITY)
Admission: RE | Disposition: A | Discharge status: Home / Self Care | Payer: Self-pay | Source: Ambulatory Visit | Attending: Family Medicine

## 2017-06-11 ENCOUNTER — Ambulatory Visit (HOSPITAL_COMMUNITY): Payer: Medicaid Other | Admitting: Anesthesiology

## 2017-06-11 ENCOUNTER — Ambulatory Visit (HOSPITAL_COMMUNITY)
Admission: RE | Admit: 2017-06-11 | Discharge: 2017-06-11 | Disposition: A | Discharge status: Home / Self Care | Payer: Medicaid Other | Source: Ambulatory Visit | Attending: Family Medicine | Admitting: Family Medicine

## 2017-06-11 ENCOUNTER — Other Ambulatory Visit: Payer: Self-pay

## 2017-06-11 ENCOUNTER — Ambulatory Visit (HOSPITAL_COMMUNITY): Payer: Medicaid Other

## 2017-06-11 DIAGNOSIS — O021 Missed abortion: Secondary | ICD-10-CM | POA: Diagnosis present

## 2017-06-11 HISTORY — PX: DILATION AND EVACUATION: SHX1459

## 2017-06-11 SURGERY — DILATION AND EVACUATION, UTERUS
Anesthesia: General

## 2017-06-11 MED ORDER — DEXAMETHASONE SODIUM PHOSPHATE 4 MG/ML IJ SOLN
INTRAMUSCULAR | Status: DC | PRN
  Administered 2017-06-11: 5 mg via INTRAVENOUS

## 2017-06-11 MED ORDER — METHYLERGONOVINE MALEATE 0.2 MG/ML IJ SOLN
INTRAMUSCULAR | Status: DC | PRN
  Administered 2017-06-11: 0.2 mg via INTRAMUSCULAR

## 2017-06-11 MED ORDER — PROPOFOL 10 MG/ML IV BOLUS
INTRAVENOUS | Status: AC
  Filled 2017-06-11: qty 40

## 2017-06-11 MED ORDER — FENTANYL CITRATE (PF) 100 MCG/2ML IJ SOLN
25.0000 ug | INTRAMUSCULAR | Status: DC | PRN
  Administered 2017-06-11: 50 ug via INTRAVENOUS

## 2017-06-11 MED ORDER — SCOPOLAMINE 1 MG/3DAYS TD PT72
MEDICATED_PATCH | TRANSDERMAL | Status: AC
  Administered 2017-06-11: 1.5 mg via TRANSDERMAL
  Filled 2017-06-11: qty 1

## 2017-06-11 MED ORDER — IBUPROFEN 600 MG PO TABS: 600.0000 mg | ORAL_TABLET | Freq: Four times a day (QID) | ORAL | 0 refills | Status: DC | PRN

## 2017-06-11 MED ORDER — METHYLERGONOVINE MALEATE 0.2 MG/ML IJ SOLN
INTRAMUSCULAR | Status: AC
  Filled 2017-06-11: qty 1

## 2017-06-11 MED ORDER — SCOPOLAMINE 1 MG/3DAYS TD PT72
1.0000 | MEDICATED_PATCH | Freq: Once | TRANSDERMAL | Status: DC
  Administered 2017-06-11: 1.5 mg via TRANSDERMAL

## 2017-06-11 MED ORDER — LIDOCAINE HCL (CARDIAC) 20 MG/ML IV SOLN
INTRAVENOUS | Status: DC | PRN
  Administered 2017-06-11: 50 mg via INTRAVENOUS

## 2017-06-11 MED ORDER — DOCUSATE SODIUM 100 MG PO CAPS: 100.0000 mg | ORAL_CAPSULE | Freq: Two times a day (BID) | ORAL | 2 refills | Status: DC | PRN

## 2017-06-11 MED ORDER — LACTATED RINGERS IV SOLN
INTRAVENOUS | Status: DC
  Administered 2017-06-11: 1000 mL via INTRAVENOUS
  Administered 2017-06-11 (×2): via INTRAVENOUS

## 2017-06-11 MED ORDER — DOXYCYCLINE HYCLATE 100 MG IV SOLR
200.0000 mg | INTRAVENOUS | Status: AC
  Administered 2017-06-11: 200 mg via INTRAVENOUS
  Filled 2017-06-11: qty 200

## 2017-06-11 MED ORDER — OXYCODONE-ACETAMINOPHEN 5-325 MG PO TABS: 1.0000 | ORAL_TABLET | Freq: Four times a day (QID) | ORAL | Status: DC | PRN

## 2017-06-11 MED ORDER — IBUPROFEN 600 MG PO TABS: 600.0000 mg | ORAL_TABLET | Freq: Four times a day (QID) | ORAL | Status: DC | PRN

## 2017-06-11 MED ORDER — FENTANYL CITRATE (PF) 100 MCG/2ML IJ SOLN
INTRAMUSCULAR | Status: AC
  Filled 2017-06-11: qty 2

## 2017-06-11 MED ORDER — BUPIVACAINE HCL (PF) 0.25 % IJ SOLN
INTRAMUSCULAR | Status: AC
  Filled 2017-06-11: qty 30

## 2017-06-11 MED ORDER — ACETAMINOPHEN 160 MG/5ML PO SOLN: 975.0000 mg | Freq: Once | ORAL | Status: DC

## 2017-06-11 MED ORDER — LACTATED RINGERS IV SOLN: INTRAVENOUS | Status: DC

## 2017-06-11 MED ORDER — KETOROLAC TROMETHAMINE 30 MG/ML IJ SOLN
INTRAMUSCULAR | Status: DC | PRN
  Administered 2017-06-11: 30 mg via INTRAVENOUS

## 2017-06-11 MED ORDER — ONDANSETRON HCL 4 MG/2ML IJ SOLN
INTRAMUSCULAR | Status: AC
  Filled 2017-06-11: qty 2

## 2017-06-11 MED ORDER — MIDAZOLAM HCL 2 MG/2ML IJ SOLN
INTRAMUSCULAR | Status: AC
  Filled 2017-06-11: qty 2

## 2017-06-11 MED ORDER — ACETAMINOPHEN 160 MG/5ML PO SOLN
975.0000 mg | Freq: Once | ORAL | Status: AC
  Administered 2017-06-11: 975 mg via ORAL

## 2017-06-11 MED ORDER — PROPOFOL 10 MG/ML IV BOLUS
INTRAVENOUS | Status: DC | PRN
  Administered 2017-06-11: 100 mg via INTRAVENOUS

## 2017-06-11 MED ORDER — LIDOCAINE HCL (CARDIAC) 20 MG/ML IV SOLN
INTRAVENOUS | Status: AC
  Filled 2017-06-11: qty 5

## 2017-06-11 MED ORDER — 0.9 % SODIUM CHLORIDE (POUR BTL) OPTIME
TOPICAL | Status: DC | PRN
  Administered 2017-06-11: 1000 mL

## 2017-06-11 MED ORDER — ACETAMINOPHEN 160 MG/5ML PO SOLN
ORAL | Status: AC
  Administered 2017-06-11: 975 mg via ORAL
  Filled 2017-06-11: qty 40.6

## 2017-06-11 MED ORDER — OXYCODONE-ACETAMINOPHEN 5-325 MG PO TABS: 1.0000 | ORAL_TABLET | Freq: Four times a day (QID) | ORAL | 0 refills | Status: DC | PRN

## 2017-06-11 MED ORDER — FENTANYL CITRATE (PF) 100 MCG/2ML IJ SOLN
INTRAMUSCULAR | Status: DC | PRN
  Administered 2017-06-11: 50 ug via INTRAVENOUS
  Administered 2017-06-11: 25 ug via INTRAVENOUS

## 2017-06-11 MED ORDER — DOXYCYCLINE HYCLATE 100 MG IV SOLR
200.0000 mg | INTRAVENOUS | Status: DC
  Filled 2017-06-11: qty 200

## 2017-06-11 MED ORDER — MIDAZOLAM HCL 2 MG/2ML IJ SOLN
INTRAMUSCULAR | Status: DC | PRN
  Administered 2017-06-11: 1 mg via INTRAVENOUS

## 2017-06-11 MED ORDER — ONDANSETRON HCL 4 MG/2ML IJ SOLN
INTRAMUSCULAR | Status: DC | PRN
  Administered 2017-06-11: 4 mg via INTRAVENOUS

## 2017-06-11 MED ORDER — DEXAMETHASONE SODIUM PHOSPHATE 10 MG/ML IJ SOLN
INTRAMUSCULAR | Status: AC
  Filled 2017-06-11: qty 1

## 2017-06-11 SURGICAL SUPPLY — 19 items
CATH ROBINSON RED A/P 16FR (CATHETERS) ×3 IMPLANT
DECANTER SPIKE VIAL GLASS SM (MISCELLANEOUS) ×3 IMPLANT
GLOVE BIOGEL PI IND STRL 7.0 (GLOVE) ×1 IMPLANT
GLOVE BIOGEL PI IND STRL 7.5 (GLOVE) ×1 IMPLANT
GLOVE BIOGEL PI INDICATOR 7.0 (GLOVE) ×2
GLOVE BIOGEL PI INDICATOR 7.5 (GLOVE) ×2
GLOVE ECLIPSE 7.5 STRL STRAW (GLOVE) ×3 IMPLANT
GOWN STRL REUS W/TWL LRG LVL3 (GOWN DISPOSABLE) ×6 IMPLANT
KIT BERKELEY 1ST TRIMESTER 3/8 (MISCELLANEOUS) ×3 IMPLANT
NS IRRIG 1000ML POUR BTL (IV SOLUTION) ×3 IMPLANT
PACK VAGINAL MINOR WOMEN LF (CUSTOM PROCEDURE TRAY) ×3 IMPLANT
PAD OB MATERNITY 4.3X12.25 (PERSONAL CARE ITEMS) ×3 IMPLANT
PAD PREP 24X48 CUFFED NSTRL (MISCELLANEOUS) ×3 IMPLANT
SET BERKELEY SUCTION TUBING (SUCTIONS) ×3 IMPLANT
TOWEL OR 17X24 6PK STRL BLUE (TOWEL DISPOSABLE) ×6 IMPLANT
VACURETTE 10 RIGID CVD (CANNULA) IMPLANT
VACURETTE 7MM CVD STRL WRAP (CANNULA) ×3 IMPLANT
VACURETTE 8 RIGID CVD (CANNULA) IMPLANT
VACURETTE 9 RIGID CVD (CANNULA) IMPLANT

## 2017-06-11 NOTE — Anesthesia Procedure Notes (Signed)
Procedure Name: LMA Insertion Date/Time: 06/11/2017 2:26 PM Performed by: Earmon PhoenixWilkerson, Keanthony Poole P, CRNA Pre-anesthesia Checklist: Patient identified, Emergency Drugs available, Suction available, Patient being monitored and Timeout performed Patient Re-evaluated:Patient Re-evaluated prior to induction Oxygen Delivery Method: Circle system utilized Preoxygenation: Pre-oxygenation with 100% oxygen Induction Type: IV induction Ventilation: Mask ventilation without difficulty LMA: LMA inserted LMA Size: 3.0 Number of attempts: 1 Placement Confirmation: positive ETCO2,  CO2 detector and breath sounds checked- equal and bilateral Tube secured with: Tape Dental Injury: Teeth and Oropharynx as per pre-operative assessment

## 2017-06-11 NOTE — Discharge Instructions (Addendum)
Incomplete Miscarriage °A miscarriage is the sudden loss of an unborn baby (fetus) before the 20th week of pregnancy. In an incomplete miscarriage, parts of the fetus or placenta (afterbirth) remain in the body. °Having a miscarriage can be an emotional experience. Talk with your health care provider about any questions you may have about miscarrying, the grieving process, and your future pregnancy plans. °What are the causes? °· Problems with the fetal chromosomes that make it impossible for the baby to develop normally. Problems with the baby's genes or chromosomes are most often the result of errors that occur by chance as the embryo divides and grows. The problems are not inherited from the parents. °· Infection of the cervix or uterus. °· Hormone problems. °· Problems with the cervix, such as having an incompetent cervix. This is when the tissue in the cervix is not strong enough to hold the pregnancy. °· Problems with the uterus, such as an abnormally shaped uterus, uterine fibroids, or congenital abnormalities. °· Certain medical conditions. °· Smoking, drinking alcohol, or taking illegal drugs. °· Trauma. °What are the signs or symptoms? °· Vaginal bleeding or spotting, with or without cramps or pain. °· Pain or cramping in the abdomen or lower back. °· Passing fluid, tissue, or blood clots from the vagina. °How is this diagnosed? °Your health care provider will perform a physical exam. You may also have an ultrasound to confirm the miscarriage. Blood or urine tests may also be ordered. °How is this treated? °· Usually, a dilation and curettage (D&C) procedure is performed. During a D&C procedure, the cervix is widened (dilated) and any remaining fetal or placental tissue is gently removed from the uterus. °· Antibiotic medicines are prescribed if there is an infection. Other medicines may be given to reduce the size of the uterus (contract) if there is a lot of bleeding. °· If you have Rh negative blood and  your baby was Rh positive, you will need a Rho (D) immune globulin shot. This shot will protect any future baby from having Rh blood problems in future pregnancies. °· You may be confined to bed rest. This means you should stay in bed and only get up to use the bathroom. °Follow these instructions at home: °· Rest as directed by your health care provider. °· Restrict activity as directed by your health care provider. You may be allowed to continue light activity if curettage was not done but you require further treatment. °· Keep track of the number of pads you use each day. Keep track of how soaked (saturated) they are. Record this information. °· Do not  use tampons. °· Do not douche or have sexual intercourse until approved by your health care provider. °· Keep all follow-up appointments for reevaluation and continuing management. °· Only take over-the-counter or prescription medicines for pain, fever, or discomfort as directed by your health care provider. °· Take antibiotic medicine as directed by your health care provider. Make sure you finish it even if you start to feel better. °Get help right away if: °· You experience severe cramps in your stomach, back, or abdomen. °· You have an unexplained temperature (make sure to record these temperatures). °· You pass large clots or tissue (save these for your health care provider to inspect). °· Your bleeding increases. °· You become light-headed, weak, or have fainting episodes. °This information is not intended to replace advice given to you by your health care provider. Make sure you discuss any questions you have with your health   care provider. Document Released: 02/19/2005 Document Revised: 07/28/2015 Document Reviewed: 09/18/2012 Elsevier Interactive Patient Education  2017 [employer]   Post Anesthesia Home Care Instructions  No ibuprofen products until:  9:00 PM TONIGHT   Activity: Get plenty of rest for the remainder of the day. A responsible  individual must stay with you for 24 hours following the procedure.  For the next 24 hours, DO NOT: -Drive a car -Advertising copywriterperate machinery -Drink alcoholic beverages -Take any medication unless instructed by your physician -Make any legal decisions or sign important papers.  Meals: Start with liquid foods such as gelatin or soup. Progress to regular foods as tolerated. Avoid greasy, spicy, heavy foods. If nausea and/or vomiting occur, drink only clear liquids until the nausea and/or vomiting subsides. Call your physician if vomiting continues.  Special Instructions/Symptoms: Your throat may feel dry or sore from the anesthesia or the breathing tube placed in your throat during surgery. If this causes discomfort, gargle with warm salt water. The discomfort should disappear within 24 hours.  If you had a scopolamine patch placed behind your ear for the management of post- operative nausea and/or vomiting:  1. The medication in the patch is effective for 72 hours, after which it should be removed.  Wrap patch in a tissue and discard in the trash. Wash hands thoroughly with soap and water. 2. You may remove the patch earlier than 72 hours if you experience unpleasant side effects which may include dry mouth, dizziness or visual disturbances. 3. Avoid touching the patch. Wash your hands with soap and water after contact with the patch.

## 2017-06-11 NOTE — Op Note (Signed)
[facility]rell [facility]ch[facility] PROCEDURE DATE:  06/11/2017  PREOPERATIVE DIAGNOSIS: missed abortion @ 6446w3d POSTOPERATIVE DIAGNOSIS: The same PROCEDURE:  Dilation and curettage under ultrasound guidance SURGEON:  Dr. Baldemar LenisK. Meryl Davis  INDICATIONS: 37 y.o.  [id number] presenting with bleeding s/p missed abortion, who desires surgical management.  Risks of surgery were discussed with the patient including but not limited to: bleeding which may require transfusion; infection which may require antibiotics; injury to uterus or surrounding organs; need for additional procedures including laparotomy or laparoscopy; possibility of intrauterine scarring which may impair future fertility; and other postoperative/anesthesia complications. Written informed consent was obtained.  FINDINGS:   Retained products of conception within uterus. Empty endometrial stripe noted on ultrasound at the end of the procedure.   ANESTHESIA:    General sedation INTRAVENOUS FLUIDS:  1500 ml of LR ESTIMATED BLOOD LOSS:  500 ml. SPECIMENS:  Products of conception sent to pathology COMPLICATIONS:  None immediate.  PROCEDURE DETAILS:  The patient received intravenous Doxycycline while in the preoperative area.  She was then taken to the operating room where anesthesia was administered and was found to be adequate.  After an adequate timeout was performed, she was placed in the dorsal lithotomy position and examined; then prepped and draped in the sterile manner.   Her bladder was catheterized for an unmeasured amount of clear, yellow urine. A vaginal speculum was then placed in the patient's vagina and a single tooth tenaculum was applied to the anterior lip of the cervix.  The cervix was gently dilated to accommodate a 7 mm suction curette that was gently advanced to the uterine fundus.  The suction device was then activated and curette slowly rotated to clear the uterus of products of conception. This was repeated until the endometrial cavity was cleared  until only blood noted in suction device. Some brisk bleeding noted and ultrasound called for. A small amount of products of conception was noted at fundus and was removed with suction curette under ultrasound guidance. Subsequently, a clean stripe was noted on ultrasound. Methergine given to assist with bleeding which had improved. There was minimal bleeding noted at the end of the procedure, and the tenaculum removed with good hemostasis noted.     All instruments were removed from the patient's vagina.  Sponge and instrument counts were correct times three.  The patient tolerated the procedure well and was taken to the recovery area awake, extubated and in stable condition.  The patient will be discharged to home as per PACU criteria.  Routine postoperative instructions given.  She was prescribed Percocet, Ibuprofen and Colace.  She will follow up in the clinic in 2-3 weeks for postoperative evaluation.   Baldemar LenisK. Meryl Davis, M.D. Attending Obstetrician & Gynecologist, [facility], [facility]

## 2017-06-11 NOTE — H&P (Signed)
OB/GYN History and Physical  Lindsey Mcbride is a 37 y.o. G3P1011 presenting for surgical management of missed abortion. She had presented to [facility] for brownish discharge and was found to have a non-viable pregnancy. She continues to have brownish discharge although cramping has improved. This was a desired pregnancy and she was scheduled for her first prenatal appointment this week. Denies other complaints.        Past Medical History:  Diagnosis Date  . Headache    migraines  . Hx of migraine headaches     Past Surgical History:  Procedure Laterality Date  . DILATION AND CURETTAGE OF UTERUS    . NO PAST SURGERIES      OB History  Gravida Para Term Preterm AB Living  3 1 1   1 1   SAB TAB Ectopic Multiple Live Births  1     0 1    # Outcome Date GA Lbr Len/2nd Weight Sex Delivery Anes PTL Lv  3 Current           2 Term 07/21/14 4673w5d 06:34 / 00:35 7 lb 8.3 oz (3.41 kg) F Vag-Spont Local  LIV  1 SAB 06/08/12 4772w0d            Complications: S/P D&C (status post dilation and curettage)    Social History   Socioeconomic History  . Marital status: Single    Spouse name: Not on file  . Number of children: Not on file  . Years of education: Not on file  . Highest education level: Not on file  Occupational History  . Not on file  Social Needs  . Financial resource strain: Not on file  . Food insecurity:    Worry: Not on file    Inability: Not on file  . Transportation needs:    Medical: Not on file    Non-medical: Not on file  Tobacco Use  . Smoking status: Never Smoker  . Smokeless tobacco: Never Used  Substance and Sexual Activity  . Alcohol use: No  . Drug use: No  . Sexual activity: Yes    Birth control/protection: None  Lifestyle  . Physical activity:    Days per week: Not on file    Minutes per session: Not on file  . Stress: Not on file  Relationships  . Social connections:    Talks on phone: Not on file    Gets together: Not on file    Attends  religious service: Not on file    Active member of club or organization: Not on file    Attends meetings of clubs or organizations: Not on file    Relationship status: Not on file  Other Topics Concern  . Not on file  Social History Narrative  . Not on file    History reviewed. No pertinent family history.  Facility-Administered Medications Prior to Admission  Medication Dose Route Frequency Provider Last Rate Last Dose  . doxycycline (VIBRAMYCIN) 200 mg in dextrose 5 % 250 mL IVPB  200 mg Intravenous Once Stinson, Jacob J, DO       Medications Prior to Admission  Medication Sig Dispense Refill Last Dose  . acetaminophen (TYLENOL) 325 MG tablet Take 325-650 mg by mouth every 6 (six) hours as needed for mild pain or moderate pain.    Past Week at Unknown time  . Prenatal Vit-Fe Fumarate-FA (PRENATAL MULTIVITAMIN) TABS tablet Take 1 tablet by mouth at bedtime.   Past Week at Unknown time    No  Known Allergies  Review of Systems: Negative except for what is mentioned in HPI.     Physical Exam: BP 120/79   Pulse 76   Temp 98.5 F (36.9 C) (Oral)   Resp 16   Ht 5\' 4"  (1.626 m)   Wt 123 lb (55.8 kg)   LMP 03/30/2017   SpO2 100%   BMI 21.11 kg/m  CONSTITUTIONAL: Well-developed, well-nourished female in no acute distress.  HENT:  Normocephalic, atraumatic, External right and left ear normal. Oropharynx is clear and moist EYES: Conjunctivae and EOM are normal. Pupils are equal, round, and reactive to light. No scleral icterus.  NECK: Normal range of motion, supple, no masses SKIN: Skin is warm and dry. No rash noted. Not diaphoretic. No erythema. No pallor. NEUROLGIC: Alert and oriented to person, place, and time. Normal reflexes, muscle tone coordination. No cranial nerve deficit noted. PSYCHIATRIC: Normal mood and affect. Normal behavior. Normal judgment and thought content. CARDIOVASCULAR: Normal heart rate noted, regular rhythm RESPIRATORY: Effort and breath sounds normal,  no problems with respiration noted ABDOMEN: Soft, nontender, nondistended, gravid. PELVIC: Deferred MUSCULOSKELETAL: Normal range of motion. No edema and no tenderness. 2+ distal pulses.   Pertinent Labs/Studies:   No results found for this or any previous visit (from the past 72 hour(s)).     Assessment and Plan :Lindsey Mcbride is a 37 y.o. G3P1011 admitted for surgical management of missed abortion. She has previously been counseled on options and opted for surgical management. The risks of suction D&C were reviewed with the patient; including but not limited to: infection which may require antibiotics; bleeding which may require transfusion or re-operation; injury to bowel, bladder, ureters or other surrounding organs; need for additional procedures. She declines to have products sent to pathology. The patient concurred with the proposed plan, giving informed consent for the procedure. She is agreeable to a blood transfusion in the event of emergency.  Patient is NPO Anesthesia aware Preoperative prophylactic antibiotics ordered SCDs  Admission labs To OR when ready    K. Therese Sarah, M.D. Attending Obstetrician & Gynecologist, [facility] for [employer], [facility]

## 2017-06-11 NOTE — Anesthesia Preprocedure Evaluation (Signed)
Anesthesia Evaluation  Patient identified by MRN, date of birth, ID band Patient awake    Reviewed: Allergy & Precautions, H&P , Patient's Chart, lab work & pertinent test results  Airway Mallampati: II  TM Distance: >3 FB Neck ROM: full    Dental  (+) Teeth Intact   Pulmonary    breath sounds clear to auscultation       Cardiovascular  Rhythm:regular Rate:Normal     Neuro/Psych    GI/Hepatic   Endo/Other    Renal/GU      Musculoskeletal   Abdominal   Peds  Hematology   Anesthesia Other Findings       Reproductive/Obstetrics (+) Pregnancy                             Anesthesia Physical Anesthesia Plan  ASA: II  Anesthesia Plan: General   Post-op Pain Management:    Induction: Intravenous  PONV Risk Score and Plan: 2 and Dexamethasone, Ondansetron, Treatment may vary due to age or medical condition and Scopolamine patch - Pre-op  Airway Management Planned: LMA  Additional Equipment:   Intra-op Plan:   Post-operative Plan:   Informed Consent:   Plan Discussed with: CRNA and Surgeon  Anesthesia Plan Comments: ( )        Anesthesia Quick Evaluation

## 2017-06-11 NOTE — Transfer of Care (Signed)
Immediate Anesthesia Transfer of Care Note  Patient: Lindsey Mcbride  Procedure(s) Performed: DILATATION AND EVACUATION (N/A )  Patient Location: PACU  Anesthesia Type:General  Level of Consciousness: sedated  Airway & Oxygen Therapy: Patient Spontanous Breathing and Patient connected to nasal cannula oxygen  Post-op Assessment: Report given to RN  Post vital signs: Reviewed and stable  Last Vitals:  Vitals Value Taken Time  BP 97/77 06/11/2017  3:25 PM  Temp    Pulse 76 06/11/2017  3:28 PM  Resp 9 06/11/2017  3:28 PM  SpO2 100 % 06/11/2017  3:28 PM  Vitals shown include unvalidated device data.  Last Pain:  Vitals:   06/11/17 1314  TempSrc: Oral      Patients Stated Pain Goal: 4 (06/11/17 1314)  Complications: No apparent anesthesia complications

## 2017-06-12 ENCOUNTER — Encounter (HOSPITAL_COMMUNITY): Payer: Self-pay | Admitting: Obstetrics and Gynecology

## 2017-06-12 NOTE — Anesthesia Postprocedure Evaluation (Signed)
Anesthesia Post Note  Patient: Lindsey Mcbride  Procedure(s) Performed: DILATATION AND EVACUATION (N/A )     Patient location during evaluation: PACU Anesthesia Type: General Level of consciousness: awake and alert Pain management: pain level controlled Vital Signs Assessment: post-procedure vital signs reviewed and stable Respiratory status: spontaneous breathing, nonlabored ventilation, respiratory function stable and patient connected to nasal cannula oxygen Cardiovascular status: blood pressure returned to baseline and stable Postop Assessment: no apparent nausea or vomiting Anesthetic complications: no    Last Vitals:  Vitals:   06/11/17 1730 06/11/17 1807  BP: 114/69 117/62  Pulse: 65 68  Resp:    Temp:  36.8 C  SpO2: 100% 100%    Last Pain:  Vitals:   06/11/17 1715  TempSrc:   PainSc: 0-No pain                 Rodriguez Aguinaldo EDWARD

## 2017-07-04 ENCOUNTER — Encounter: Payer: Self-pay | Admitting: Family Medicine

## 2017-07-04 ENCOUNTER — Ambulatory Visit (INDEPENDENT_AMBULATORY_CARE_PROVIDER_SITE_OTHER): Payer: Medicaid Other | Admitting: Family Medicine

## 2017-07-04 VITALS — Wt 123.3 lb

## 2017-07-04 DIAGNOSIS — O021 Missed abortion: Secondary | ICD-10-CM

## 2017-07-04 NOTE — Progress Notes (Signed)
   Subjective:    Patient ID: Lindsey Mcbride, female    DOB: 07/08/1980, 37 y.o.   MRN: [id number]  HPI Lindsey Mcbride is a 37 year old G45P1021 non-pregnant female who presents to [facility] on 07/04/2017 for a post-operation follow-up after a D&C on 06/11/2017 for a missed abortion at [redacted]w[redacted]d gestation. She reports this is her 2nd spontaneous abortion and desires to continue to try and become pregnant when possible. Patient reports her vaginal bleeding completely stopped 2 days ago. She admits that her abdominal cramps have resided and no longer requires pain medication. Patient has no complaints at this time. Patient denies fever, chills, abdominal pain, changes in urination/bowel movements, and vaginal bleeding.   Review of Systems  Constitutional: Negative for chills, fatigue and fever.  Respiratory: Negative.   Cardiovascular: Negative.   Gastrointestinal: Negative for abdominal pain, constipation, diarrhea, nausea and vomiting.  Genitourinary: Negative for difficulty urinating, flank pain, pelvic pain, vaginal bleeding (completely stopped 2 days ago), vaginal discharge and vaginal pain.  Musculoskeletal: Negative.   Skin: Negative.   Neurological: Negative for dizziness, weakness and light-headedness.       Objective:   Physical Exam  Constitutional: She is oriented to person, place, and time. She appears well-developed and well-nourished.  Cardiovascular: Normal rate, regular rhythm and normal heart sounds.  No murmur heard. Pulmonary/Chest: Effort normal and breath sounds normal. No respiratory distress. She has no wheezes.  Abdominal: Soft. She exhibits no distension and no mass. There is no tenderness. There is no guarding.  Neurological: She is alert and oriented to person, place, and time.  Psychiatric:  Patient became tearful during exam  Nursing note and vitals reviewed.      Assessment & Plan:  Post D&C follow-up: -Patient advised to use condoms for 3 months  until trying to become pregnant again -Patient instructed to return to clinic if she experiences increase in vaginal bleeding or severe abdominal pain no controlled on pain medication -Patient advised if she is unable to become pregnant for a year, to return to clinic for further work-up.   Caprice Renshaw, PA-S 07/04/2017

## 2017-11-06 ENCOUNTER — Ambulatory Visit: Payer: Medicaid Other | Admitting: Obstetrics & Gynecology

## 2017-11-06 ENCOUNTER — Inpatient Hospital Stay (HOSPITAL_COMMUNITY): Payer: Medicaid Other

## 2017-11-06 ENCOUNTER — Other Ambulatory Visit: Payer: Self-pay

## 2017-11-06 ENCOUNTER — Encounter (HOSPITAL_COMMUNITY): Payer: Self-pay | Admitting: *Deleted

## 2017-11-06 ENCOUNTER — Inpatient Hospital Stay (HOSPITAL_COMMUNITY)
Admission: AD | Admit: 2017-11-06 | Discharge: 2017-11-06 | Disposition: A | Discharge status: Home / Self Care | Payer: Medicaid Other | Source: Ambulatory Visit | Attending: Obstetrics & Gynecology | Admitting: Obstetrics & Gynecology

## 2017-11-06 ENCOUNTER — Encounter: Payer: Self-pay | Admitting: Family Medicine

## 2017-11-06 DIAGNOSIS — Z3A10 10 weeks gestation of pregnancy: Secondary | ICD-10-CM | POA: Diagnosis not present

## 2017-11-06 DIAGNOSIS — O26891 Other specified pregnancy related conditions, first trimester: Secondary | ICD-10-CM | POA: Insufficient documentation

## 2017-11-06 DIAGNOSIS — O468X1 Other antepartum hemorrhage, first trimester: Secondary | ICD-10-CM

## 2017-11-06 DIAGNOSIS — R109 Unspecified abdominal pain: Secondary | ICD-10-CM

## 2017-11-06 DIAGNOSIS — O418X10 Other specified disorders of amniotic fluid and membranes, first trimester, not applicable or unspecified: Secondary | ICD-10-CM

## 2017-11-06 DIAGNOSIS — O208 Other hemorrhage in early pregnancy: Secondary | ICD-10-CM | POA: Diagnosis not present

## 2017-11-06 DIAGNOSIS — O09521 Supervision of elderly multigravida, first trimester: Secondary | ICD-10-CM | POA: Diagnosis not present

## 2017-11-06 DIAGNOSIS — Z3A01 Less than 8 weeks gestation of pregnancy: Secondary | ICD-10-CM | POA: Diagnosis not present

## 2017-11-06 DIAGNOSIS — Z3491 Encounter for supervision of normal pregnancy, unspecified, first trimester: Secondary | ICD-10-CM

## 2017-11-06 DIAGNOSIS — M549 Dorsalgia, unspecified: Secondary | ICD-10-CM | POA: Insufficient documentation

## 2017-11-06 DIAGNOSIS — Z3490 Encounter for supervision of normal pregnancy, unspecified, unspecified trimester: Secondary | ICD-10-CM

## 2017-11-06 DIAGNOSIS — R1031 Right lower quadrant pain: Secondary | ICD-10-CM | POA: Insufficient documentation

## 2017-11-06 LAB — CBC
HCT: 35.9 % — ABNORMAL LOW (ref 36.0–46.0)
Hemoglobin: 11.8 g/dL — ABNORMAL LOW (ref 12.0–15.0)
MCH: 28.1 pg (ref 26.0–34.0)
MCHC: 32.9 g/dL (ref 30.0–36.0)
MCV: 85.5 fL (ref 78.0–100.0)
Platelets: 168 10*3/uL (ref 150–400)
RBC: 4.2 MIL/uL (ref 3.87–5.11)
RDW: 14.6 % (ref 11.5–15.5)
WBC: 6.3 10*3/uL (ref 4.0–10.5)

## 2017-11-06 LAB — URINALYSIS, ROUTINE W REFLEX MICROSCOPIC
Bilirubin Urine: NEGATIVE
Glucose, UA: NEGATIVE mg/dL
HGB URINE DIPSTICK: NEGATIVE
Ketones, ur: NEGATIVE mg/dL
LEUKOCYTES UA: NEGATIVE
NITRITE: NEGATIVE
PROTEIN: NEGATIVE mg/dL
SPECIFIC GRAVITY, URINE: 1.027 (ref 1.005–1.030)
pH: 5 (ref 5.0–8.0)

## 2017-11-06 LAB — POCT PREGNANCY, URINE: Preg Test, Ur: POSITIVE — AB

## 2017-11-06 LAB — WET PREP, GENITAL
Clue Cells Wet Prep HPF POC: NONE SEEN
Sperm: NONE SEEN
Trich, Wet Prep: NONE SEEN
Yeast Wet Prep HPF POC: NONE SEEN

## 2017-11-06 LAB — HCG, QUANTITATIVE, PREGNANCY: hCG, Beta Chain, Quant, S: 44502 m[IU]/mL — ABNORMAL HIGH (ref <5)

## 2017-11-06 NOTE — Progress Notes (Signed)
Patient presents to office today for UPT. UPT +. Patient reports first positive home test 3 days. LMP 08/28/17 but is unsure due to recent miscarriage.

## 2017-11-06 NOTE — MAU Note (Signed)
Lowered abd pain, cramping in RLQ and lower back. Off and of for 3 days.  No bleeding.  +HPT 3 days ago.  Sent here from clinic after +preg test

## 2017-11-06 NOTE — Progress Notes (Signed)
Reported to Vonzella Nipple, PA that pt is reporting RLQ pain that is intermittent.  She last episode of pain was this am and is currently not having any pain.  Pt states that she has had a miscarriage and D&C in April is concerned.  Per provider pt should go to [facility]U for ectopic evaluation. Notified pt of providers recommendation.  Pt agreed.  Report called to [facility] in [facility]U and pt walked up to [facility]U.

## 2017-11-06 NOTE — MAU Provider Note (Addendum)
History     CSN: [id number]  Arrival date and time: 11/06/17 1740   Provider's first contact with patient at 1930     Chief Complaint  Patient presents with  . Abdominal Pain  . Back Pain  . Possible Pregnancy   HPI  Ms.  Lindsey Mcbride is a 37 y.o. year old [id number] female at [redacted]w[redacted]d weeks gestation who presents to MAU reporting intermittent, lower abdominal pain/cramping, unsure LMP ("very irregular since last SAB"), (+) HPT 3 days ago.She was sent to MAU for evaluation after (+) UPT and c/o abdominal pain/cramping at [facility] today. She is concerned of miscarriage.  Past Medical History:  Diagnosis Date  . Headache    migraines  . Hx of migraine headaches     Past Surgical History:  Procedure Laterality Date  . DILATION AND CURETTAGE OF UTERUS    . DILATION AND EVACUATION N/A 06/11/2017   Procedure: DILATATION AND EVACUATION;  Surgeon: Conan Bowens, MD;  Location: [facility];  Service: Gynecology;  Laterality: N/A;    Family History  Problem Relation Age of Onset  . Healthy Mother   . Healthy Father     Social History   Tobacco Use  . Smoking status: Never Smoker  . Smokeless tobacco: Never Used  Substance Use Topics  . Alcohol use: No  . Drug use: No    Allergies: No Known Allergies  No medications prior to admission.    Review of Systems  Constitutional: Negative.   HENT: Negative.   Eyes: Negative.   Respiratory: Negative.   Cardiovascular: Negative.   Gastrointestinal: Positive for abdominal pain.  Endocrine: Negative.   Genitourinary: Positive for pelvic pain.  Musculoskeletal: Negative.   Skin: Negative.   Allergic/Immunologic: Negative.   Neurological: Negative.   Hematological: Negative.   Psychiatric/Behavioral: Negative.    Physical Exam   Blood pressure 107/70, pulse 68, temperature 98.1 F (36.7 C), temperature source Oral, resp. rate 16, height 5\' 4"  (1.626 m), weight 55 kg, last menstrual period 08/28/2017, SpO2 100 %.  Physical  Exam  Nursing note and vitals reviewed. Constitutional: She is oriented to person, place, and time. She appears well-developed and well-nourished.  HENT:  Head: Normocephalic and atraumatic.  Eyes: Pupils are equal, round, and reactive to light.  Neck: Normal range of motion.  Cardiovascular: Normal rate, regular rhythm and normal heart sounds.  Respiratory: Effort normal and breath sounds normal.  GI: Soft. Bowel sounds are normal.  Genitourinary:  Genitourinary Comments: Uterus: non-tender, enlarged, S=D, SE: cervix is smooth, pink, no lesions, scant amt of thick, white vaginal d/c -- WP, GC/CT done, closed/long/firm, no CMT or friability, no adnexal tenderness   Musculoskeletal: Normal range of motion.  Neurological: She is alert and oriented to person, place, and time.  Skin: Skin is warm and dry.  Psychiatric: She has a normal mood and affect. Her behavior is normal. Judgment and thought content normal.    MAU Course  Procedures  MDM CCUA UPT CBC w/Diff ABO/Rh HCG Wet Prep GC/CT -- pending HIV -- pending OB < 14 wks [place] with TV  Results for orders placed or performed during the hospital encounter of 11/06/17 (from the past 24 hour(s))  Urinalysis, Routine w reflex microscopic     Status: None   Collection Time: 11/06/17  6:01 PM  Result Value Ref Range   Color, Urine YELLOW YELLOW   APPearance CLEAR CLEAR   Specific Gravity, Urine 1.027 1.005 - 1.030   pH 5.0 5.0 - 8.0  Glucose, UA NEGATIVE NEGATIVE mg/dL   Hgb urine dipstick NEGATIVE NEGATIVE   Bilirubin Urine NEGATIVE NEGATIVE   Ketones, ur NEGATIVE NEGATIVE mg/dL   Protein, ur NEGATIVE NEGATIVE mg/dL   Nitrite NEGATIVE NEGATIVE   Leukocytes, UA NEGATIVE NEGATIVE  CBC     Status: Abnormal   Collection Time: 11/06/17  6:22 PM  Result Value Ref Range   WBC 6.3 4.0 - 10.5 K/uL   RBC 4.20 3.87 - 5.11 MIL/uL   Hemoglobin 11.8 (L) 12.0 - 15.0 g/dL   HCT 16.1 (L) 09.6 - 04.5 %   MCV 85.5 78.0 - 100.0 fL   MCH  28.1 26.0 - 34.0 pg   MCHC 32.9 30.0 - 36.0 g/dL   RDW 40.9 81.1 - 91.4 %   Platelets 168 150 - 400 K/uL  hCG, quantitative, pregnancy     Status: Abnormal   Collection Time: 11/06/17  6:22 PM  Result Value Ref Range   hCG, Beta Chain, Quant, S 44,502 (H) <5 mIU/mL    US Ob Less Than 14 Weeks With Ob Transvaginal  Result Date: 11/06/2017 CLINICAL DATA:  Right lower quadrant pain, beta HCG 44,502 EXAM: OBSTETRIC <14 WK [place] AND TRANSVAGINAL OB US TECHNIQUE: Both transabdominal and transvaginal ultrasound examinations were performed for complete evaluation of the gestation as well as the maternal uterus, adnexal regions, and pelvic cul-de-sac. Transvaginal technique was performed to assess early pregnancy. COMPARISON:  None. FINDINGS: Intrauterine gestational sac: Single though slightly irregular in shape. Yolk sac:  Present Embryo:  Present Cardiac Activity: Present Heart Rate: 123 bpm CRL:  4.2 mm   6 w   1 d                  [place] EDC: 07/01/2018 Subchorionic hemorrhage: Crescentic subchorionic focus of hemorrhage is identified measuring up to 4.7 cm in length. Maternal uterus/adnexae: Corpus luteum on the right. Trace free fluid. Normal left ovary. IMPRESSION: Viable 6 week 1 day gestational subchorionic focus of hemorrhage measuring up to 4.7 cm in length. Electronically Signed   By: Tollie Eth M.D.   On: 11/06/2017 20:50    Report given to and care assumed by Steward Drone, CNM.  Lindsey Mora, MSN, CNM 11/06/2017, 7:34 PM   [place] and lab results reviewed with patient. Educated and discussed change of due date based on today's [place] and measuring [redacted]w[redacted]d. Educated on what to expect with vaginal bleeding and diagnosed subchorionic hematoma, discussed reasons to return to MAU for increased bleeding and /or worsening abdominal pain. Patient verbalizes understanding. Pt stable at time of discharge.  Assessment and Plan   1. Normal IUP (intrauterine pregnancy) on prenatal ultrasound, first trimester   2.  Abdominal pain during pregnancy in first trimester   3. [redacted] weeks gestation of pregnancy   4. Subchorionic hematoma in first trimester, single or unspecified fetus    Discharge home  Make initial prenatal appointment for 5-6 weeks from today  Return to MAU as needed  Continue taking prenatal vitamins  Follow-up Information    Center for [facility]. Schedule an appointment as soon as possible for a visit in 5 week(s).   Specialty:  Obstetrics and Gynecology Why:  Make appointment to start prenatal care in 5 weeks  Contact information: [address] [place] [address] [phone]         Sharyon Cable, CNM 11/06/17, 9:42 PM

## 2017-11-07 LAB — GC/CHLAMYDIA PROBE AMP (~~LOC~~) NOT AT ARMC
Chlamydia: NEGATIVE
Neisseria Gonorrhea: NEGATIVE

## 2017-12-03 ENCOUNTER — Other Ambulatory Visit: Payer: Self-pay

## 2017-12-03 ENCOUNTER — Encounter (HOSPITAL_COMMUNITY): Payer: Self-pay

## 2017-12-03 ENCOUNTER — Inpatient Hospital Stay (HOSPITAL_COMMUNITY)
Admission: AD | Admit: 2017-12-03 | Discharge: 2017-12-03 | Disposition: A | Discharge status: Home / Self Care | Payer: Medicaid Other | Source: Ambulatory Visit | Attending: Family Medicine | Admitting: Family Medicine

## 2017-12-03 DIAGNOSIS — Z3A10 10 weeks gestation of pregnancy: Secondary | ICD-10-CM | POA: Diagnosis not present

## 2017-12-03 DIAGNOSIS — O209 Hemorrhage in early pregnancy, unspecified: Secondary | ICD-10-CM | POA: Diagnosis not present

## 2017-12-03 DIAGNOSIS — O468X1 Other antepartum hemorrhage, first trimester: Secondary | ICD-10-CM

## 2017-12-03 DIAGNOSIS — O418X10 Other specified disorders of amniotic fluid and membranes, first trimester, not applicable or unspecified: Secondary | ICD-10-CM

## 2017-12-03 DIAGNOSIS — O208 Other hemorrhage in early pregnancy: Secondary | ICD-10-CM | POA: Insufficient documentation

## 2017-12-03 LAB — URINALYSIS, ROUTINE W REFLEX MICROSCOPIC
Bilirubin Urine: NEGATIVE
GLUCOSE, UA: NEGATIVE mg/dL
Hgb urine dipstick: NEGATIVE
Ketones, ur: NEGATIVE mg/dL
Leukocytes, UA: NEGATIVE
Nitrite: NEGATIVE
PH: 5 (ref 5.0–8.0)
Protein, ur: NEGATIVE mg/dL
SPECIFIC GRAVITY, URINE: 1.031 — AB (ref 1.005–1.030)

## 2017-12-03 NOTE — MAU Provider Note (Signed)
History     CSN: [id number]  Arrival date and time: 12/03/17 1300   First Provider Initiated Contact with Patient 12/03/17 1324      Chief Complaint  Patient presents with  . Vaginal Bleeding  . Abdominal Pain   HPI Ms. Mekia Dipinto is a 37 y.o. [id number] at [redacted]w[redacted]d who presents to MAU today with complaint of vaginal bleeding. The patient was seen here a few weeks ago and had SIUP noted on [place] with [facility]. She states brown discharge started last night and then she had some bright red bleeding earlier and now is having light pink spotting. She has had mild cramping that is unchanged today from previous. She is very nervous because she has had miscarriages in the past.    OB History    Gravida  4   Para  1   Term  1   Preterm      AB  2   Living  1     SAB  2   TAB      Ectopic      Multiple  0   Live Births  1           Past Medical History:  Diagnosis Date  . Headache    migraines  . Hx of migraine headaches     Past Surgical History:  Procedure Laterality Date  . DILATION AND CURETTAGE OF UTERUS    . DILATION AND EVACUATION N/A 06/11/2017   Procedure: DILATATION AND EVACUATION;  Surgeon: Conan Bowens, MD;  Location: [facility];  Service: Gynecology;  Laterality: N/A;    Family History  Problem Relation Age of Onset  . Healthy Mother   . Healthy Father     Social History   Tobacco Use  . Smoking status: Never Smoker  . Smokeless tobacco: Never Used  Substance Use Topics  . Alcohol use: No  . Drug use: No    Allergies: No Known Allergies  No medications prior to admission.    Review of Systems  Constitutional: Negative for fever.  Gastrointestinal: Positive for abdominal pain. Negative for constipation, diarrhea, nausea and vomiting.  Genitourinary: Positive for vaginal bleeding. Negative for vaginal discharge.   Physical Exam   Blood pressure 111/69, pulse 88, temperature 97.9 F (36.6 C), temperature source Oral, resp. rate 18, height  5\' 4"  (1.626 m), weight 54.4 kg, last menstrual period 08/28/2017, unknown if currently breastfeeding.  Physical Exam  Nursing note and vitals reviewed. Constitutional: She is oriented to person, place, and time. She appears well-developed and well-nourished. No distress.  HENT:  Head: Normocephalic and atraumatic.  Cardiovascular: Normal rate.  Respiratory: Effort normal.  GI: Soft. She exhibits no distension and no mass. There is no tenderness. There is no rebound and no guarding.  Genitourinary: Uterus is enlarged (appropriate for GA). Uterus is not tender. Cervix exhibits no motion tenderness, no discharge and no friability. No bleeding in the vagina. No vaginal discharge found.  Neurological: She is alert and oriented to person, place, and time.  Skin: Skin is warm and dry. No erythema.  Psychiatric: She has a normal mood and affect.  Dilation: Closed Effacement (%): Thick Cervical Position: Posterior Exam by:: Raynelle Fanning, PA   MAU Course  Procedures Pt informed that the ultrasound is considered a limited OB ultrasound and is not intended to be a complete ultrasound exam.  Patient also informed that the ultrasound is not being completed with the intent of assessing for fetal or placental anomalies or  any pelvic abnormalities.  Explained that the purpose of today's ultrasound is to assess for  viability.  Patient acknowledges the purpose of the exam and the limitations of the study. SIUP noted with +FM and FHR of 161 bpm.   MDM Reviewed results from last [place] in MAU. [facility] noted. Normal SIUP Bedside US performed. Patient reassured of viable SIUP today.  Assessment and Plan  A: SIUP at [redacted]w[redacted]d Vaginal bleeding in pregnancy, first trimester Subchorionic hemorrhage previously diagnosed  P: Discharge home Pelvic rest advised  Bleeding precautions discussed Patient advised to follow-up with [facility] as planned to start prenatal care  Patient may return to MAU as needed or if her condition  were to change or worsen  Vonzella Nipple, PA-C 12/03/2017, 1:40 PM

## 2017-12-03 NOTE — Discharge Instructions (Signed)
Pelvic Rest °Pelvic rest may be recommended if: °· Your placenta is partially or completely covering the opening of your cervix (placenta previa). °· There is bleeding between the wall of the uterus and the amniotic sac in the first trimester of pregnancy (subchorionic hemorrhage). °· You went into labor too early (preterm labor). ° °Based on your overall health and the health of your baby, your health care provider will decide if pelvic rest is right for you. °How do I rest my pelvis? °For as long as told by your health care provider: °· Do not have sex, sexual stimulation, or an orgasm. °· Do not use tampons. Do not douche. Do not put anything in your vagina. °· Do not lift anything that is heavier than 10 lb (4.5 kg). °· Avoid activities that take a lot of effort (are strenuous). °· Avoid any activity in which your pelvic muscles could become strained. ° °When should I seek medical care? °Seek medical care if you have: °· Cramping pain in your lower abdomen. °· Vaginal discharge. °· A low, dull backache. °· Regular contractions. °· Uterine tightening. ° °When should I seek immediate medical care? °Seek immediate medical care if: °· You have vaginal bleeding and you are pregnant. ° °This information is not intended to replace advice given to you by your health care provider. Make sure you discuss any questions you have with your health care provider. °Document Released: 06/16/2010 Document Revised: 07/28/2015 Document Reviewed: 08/23/2014 °Elsevier Interactive Patient Education © 2018 Elsevier Inc. ° °Subchorionic Hematoma °A subchorionic hematoma is a gathering of blood between the outer wall of the placenta and the inner wall of the womb (uterus). The placenta is the organ that connects the fetus to the wall of the uterus. The placenta performs the feeding, breathing (oxygen to the fetus), and waste removal (excretory work) of the fetus. °Subchorionic hematoma is the most common abnormality found on a result  from ultrasonography done during the first trimester or early second trimester of pregnancy. If there has been little or no vaginal bleeding, early small hematomas usually shrink on their own and do not affect your baby or pregnancy. The blood is gradually absorbed over 1-2 weeks. When bleeding starts later in pregnancy or the hematoma is larger or occurs in an older pregnant woman, the outcome may not be as good. Larger hematomas may get bigger, which increases the chances for miscarriage. Subchorionic hematoma also increases the risk of premature detachment of the placenta from the uterus, preterm (premature) labor, and stillbirth. °Follow these instructions at home: °· Stay on bed rest if your health care provider recommends this. Although bed rest will not prevent more bleeding or prevent a miscarriage, your health care provider may recommend bed rest until you are advised otherwise. °· Avoid heavy lifting (more than 10 lb [4.5 kg]), exercise, sexual intercourse, or douching as directed by your health care provider. °· Keep track of the number of pads you use each day and how soaked (saturated) they are. Write down this information. °· Do not use tampons. °· Keep all follow-up appointments as directed by your health care provider. Your health care provider may ask you to have follow-up blood tests or ultrasound tests or both. °Get help right away if: °· You have severe cramps in your stomach, back, abdomen, or pelvis. °· You have a fever. °· You pass large clots or tissue. Save any tissue for your health care provider to look at. °· Your bleeding increases or you become lightheaded,   feel weak, or have fainting episodes. °This information is not intended to replace advice given to you by your health care provider. Make sure you discuss any questions you have with your health care provider. °Document Released: 06/06/2006 Document Revised: 07/28/2015 Document Reviewed: 09/18/2012 °Elsevier Interactive Patient  Education © 2017 Elsevier Inc. ° °

## 2017-12-03 NOTE — MAU Note (Signed)
Pt presents to MAU with c/o vaginal bleeding that started today. She had brown discharge yesterday but had bright red bleeding today, the bleeding has now decreased to light pink spotting. She also has had ongoing abdominal cramping since finding out she was pregnant.

## 2017-12-06 ENCOUNTER — Encounter: Payer: Self-pay | Admitting: Obstetrics and Gynecology

## 2017-12-06 DIAGNOSIS — Z8679 Personal history of other diseases of the circulatory system: Secondary | ICD-10-CM | POA: Insufficient documentation

## 2017-12-06 DIAGNOSIS — Z8759 Personal history of other complications of pregnancy, childbirth and the puerperium: Secondary | ICD-10-CM

## 2017-12-12 ENCOUNTER — Ambulatory Visit: Payer: Self-pay | Admitting: Clinical

## 2017-12-12 ENCOUNTER — Other Ambulatory Visit (HOSPITAL_COMMUNITY)
Admission: RE | Admit: 2017-12-12 | Discharge: 2017-12-12 | Disposition: A | Discharge status: Home / Self Care | Payer: Medicaid Other | Source: Ambulatory Visit | Attending: Obstetrics and Gynecology | Admitting: Obstetrics and Gynecology

## 2017-12-12 ENCOUNTER — Encounter: Payer: Self-pay | Admitting: Obstetrics and Gynecology

## 2017-12-12 ENCOUNTER — Ambulatory Visit (INDEPENDENT_AMBULATORY_CARE_PROVIDER_SITE_OTHER): Payer: Medicaid Other | Admitting: Obstetrics and Gynecology

## 2017-12-12 VITALS — BP 115/79 | HR 87 | Wt 121.6 lb

## 2017-12-12 DIAGNOSIS — Z8679 Personal history of other diseases of the circulatory system: Secondary | ICD-10-CM

## 2017-12-12 DIAGNOSIS — Z3A10 10 weeks gestation of pregnancy: Secondary | ICD-10-CM | POA: Diagnosis not present

## 2017-12-12 DIAGNOSIS — Z3490 Encounter for supervision of normal pregnancy, unspecified, unspecified trimester: Secondary | ICD-10-CM | POA: Insufficient documentation

## 2017-12-12 DIAGNOSIS — O09521 Supervision of elderly multigravida, first trimester: Secondary | ICD-10-CM | POA: Diagnosis not present

## 2017-12-12 DIAGNOSIS — Z3401 Encounter for supervision of normal first pregnancy, first trimester: Secondary | ICD-10-CM | POA: Diagnosis not present

## 2017-12-12 DIAGNOSIS — Z8759 Personal history of other complications of pregnancy, childbirth and the puerperium: Secondary | ICD-10-CM

## 2017-12-12 DIAGNOSIS — Z3491 Encounter for supervision of normal pregnancy, unspecified, first trimester: Secondary | ICD-10-CM

## 2017-12-12 DIAGNOSIS — Z23 Encounter for immunization: Secondary | ICD-10-CM | POA: Diagnosis not present

## 2017-12-12 DIAGNOSIS — G43809 Other migraine, not intractable, without status migrainosus: Secondary | ICD-10-CM | POA: Diagnosis not present

## 2017-12-12 LAB — POCT URINALYSIS DIP (DEVICE)
GLUCOSE, UA: NEGATIVE mg/dL
HGB URINE DIPSTICK: NEGATIVE
LEUKOCYTES UA: NEGATIVE
Nitrite: NEGATIVE
Protein, ur: NEGATIVE mg/dL
UROBILINOGEN UA: 0.2 mg/dL (ref 0.0–1.0)
pH: 6 (ref 5.0–8.0)

## 2017-12-12 MED ORDER — ASPIRIN EC 81 MG PO TBEC: 81.0000 mg | DELAYED_RELEASE_TABLET | Freq: Every day | ORAL | 1 refills | Status: AC

## 2017-12-12 NOTE — BH Specialist Note (Signed)
Integrated Behavioral Health Initial Visit  MRN: [id number] Name: Dachelle Molzahn  Number of Integrated Behavioral Health Clinician visits:: 1/6 Session Start time: 9:41   Session End time: 9:49 Total time: 15 minutes  Type of Service: Integrated Behavioral Health- Individual/Family Interpretor:No. Interpretor Name and Language: n/a   Warm Hand Off Completed.       SUBJECTIVE: Sydne Krahl is a 37 y.o. female accompanied by n/a Patient was referred by Venia Carbon, NP for Initial OB introduction to integrated behavioral health services . Patient reports the following symptoms/concerns: Pt states she was feeling mild anxiety today, prior to hearing baby's heartbeat, due to previous miscarriages, but no other concerns today.  Duration of problem: n/a; Severity of problem: n/a  OBJECTIVE: Mood: Normal and Affect: Appropriate Risk of harm to self or others: No plan to harm self or others  LIFE CONTEXT: Family and Social: - School/Work: - Self-Care: - Life Changes: Current pregnancy  INTERVENTIONS: Standardized Assessments completed: GAD-7 and PHQ 9  ASSESSMENT: Patient currently experiencing Supervision of low-risk pregnancy, first trimester   Patient may benefit from Initial OB introduction to integrated behavioral health services .  PLAN: 1. Follow up with behavioral health clinician on : As needed 2. Behavioral recommendations:  -Continue taking prenatal vitamin, as recommended by medical provider 3. Referral(s): Integrated [employer] (In Clinic) 4. "From scale of 1-10, how likely are you to follow plan?": 10  Rae Lips, LCSW  Depression screen [facility] 2/9 12/12/2017  Decreased Interest 0  Down, Depressed, Hopeless 0  PHQ - 2 Score 0  Altered sleeping 3  Tired, decreased energy 3  Change in appetite 2  Feeling bad or failure about yourself  0  Trouble concentrating 0  Moving slowly or fidgety/restless 1  Suicidal thoughts 0  PHQ-9  Score 9   GAD 7 : Generalized Anxiety Score 12/12/2017  Nervous, Anxious, on Edge 1  Control/stop worrying 1  Worry too much - different things 0  Trouble relaxing 0  Restless 0  Easily annoyed or irritable 0  Afraid - awful might happen 1  Total GAD 7 Score 3

## 2017-12-12 NOTE — Progress Notes (Signed)
Subjective:   Lindsey Mcbride is a 37 y.o. [id number] at [redacted]w[redacted]d by early ultrasound being seen today for her first obstetrical visit.  Her obstetrical history is significant for advanced maternal age, postpartum hypertension, migraine headaches, and miscarriage. Patient does intend to breast feed. Pregnancy history fully reviewed. Desired pregnancy.   Patient reports no complaints.  HISTORY: OB History  Gravida Para Term Preterm AB Living  4 1 1  0 2 1  SAB TAB Ectopic Multiple Live Births  2 0 0 0 1    # Outcome Date GA Lbr Len/2nd Weight Sex Delivery Anes PTL Lv  4 Current           3 Term 07/21/14 [redacted]w[redacted]d 06:34 / 00:35 7 lb 8.3 oz (3.41 kg) F Vag-Spont Local  LIV     Apgar1: 8  Apgar5: 9  2 SAB 06/08/12 [redacted]w[redacted]d            Complications: S/P D&C (status post dilation and curettage)  1 SAB             Last pap smear was done more than 3 years ago and was normal  Past Medical History:  Diagnosis Date  . Headache    migraines  . Hx of migraine headaches    Past Surgical History:  Procedure Laterality Date  . DILATION AND CURETTAGE OF UTERUS    . DILATION AND EVACUATION N/A 06/11/2017   Procedure: DILATATION AND EVACUATION;  Surgeon: Conan Bowens, MD;  Location: [facility];  Service: Gynecology;  Laterality: N/A;   Family History  Problem Relation Age of Onset  . Healthy Mother   . Healthy Father    Social History   Tobacco Use  . Smoking status: Never Smoker  . Smokeless tobacco: Never Used  Substance Use Topics  . Alcohol use: No  . Drug use: No   No Known Allergies Current Outpatient Medications on File Prior to Visit  Medication Sig Dispense Refill  . diphenhydramine-acetaminophen (TYLENOL PM) 25-500 MG TABS tablet Take 1 tablet by mouth at bedtime as needed.    . Prenatal Vit-Fe Fumarate-FA (PRENATAL MULTIVITAMIN) TABS tablet Take 1 tablet by mouth daily at 12 noon.     No current facility-administered medications on file prior to visit.     Review of  Systems Pertinent items noted in HPI and remainder of comprehensive ROS otherwise negative.  Exam   Vitals:   12/12/17 0846  BP: 115/79  Pulse: 87  Weight: 121 lb 9.6 oz (55.2 kg)   Fetal Heart Rate (bpm): 169  Uterus:     Pelvic Exam: Perineum: no hemorrhoids, normal perineum   Vulva: normal external genitalia, no lesions   Vagina:  normal mucosa, normal discharge   Cervix: no lesions and normal, pap smear done.    Adnexa: normal adnexa and no mass, fullness, tenderness   Bony Pelvis: average  System: General: well-developed, well-nourished female in no acute distress   Breast:  normal appearance, no masses or tenderness   Skin: normal coloration and turgor, no rashes   Neurologic: oriented, normal, negative, normal mood   Extremities: normal strength, tone, and muscle mass, ROM of all joints is normal   HEENT PERRLA, extraocular movement intact and sclera clear, anicteric   Mouth/Teeth mucous membranes moist, pharynx normal without lesions and dental hygiene good   Neck supple and no masses   Cardiovascular: regular rate and rhythm   Respiratory:  no respiratory distress, normal breath sounds   Abdomen: soft, non-tender;  bowel sounds normal; no masses,  no organomegaly    Assessment:   Pregnancy: [id number] Patient Active Problem List   Diagnosis Date Noted  . Supervision of low-risk first pregnancy, first trimester 12/12/2017  . AMA (advanced maternal age) multigravida 35+, first trimester 12/12/2017  . History of postpartum hypertension 12/06/2017  . Missed abortion 06/11/2017     Plan:   1. Supervision of low-risk first pregnancy, first trimester - AMB MFM GENETICS REFERRAL - Culture, OB Urine - Cytology - PAP - Flu Vaccine QUAD 36+ mos IM - Hemoglobinopathy Evaluation - Obstetric Panel, Including HIV - SMN1 COPY NUMBER ANALYSIS (SMA Carrier Screen) - Hemoglobin A1c  2. AMA (advanced maternal age) multigravida 35+, first trimester - AMB MFM GENETICS  REFERRAL  3. History of postpartum hypertension  - Rx ASA - BP good today.   Other orders - diphenhydramine-acetaminophen (TYLENOL PM) 25-500 MG TABS tablet; Take 1 tablet by mouth at bedtime as needed. - Prenatal Vit-Fe Fumarate-FA (PRENATAL MULTIVITAMIN) TABS tablet; Take 1 tablet by mouth daily at 12 noon. - aspirin EC 81 MG tablet; Take 1 tablet (81 mg total) by mouth daily. Start at 15 weeks   Initial labs drawn. Continue prenatal vitamins. Genetic Screening discussed:  declined. Ultrasound discussed; fetal anatomic survey: requested. Problem list reviewed and updated. The nature of [place] [facility] with multiple MDs and other Advanced Practice Providers was explained to patient; also emphasized that residents, students are part of our team. Routine obstetric precautions reviewed. Return in about 4 weeks (around 01/09/2018) for Please print dental note for patient. Robley Fries, Harolyn Rutherford, NP Obstetrician & Gynecologist, [facility] for [employer], [facility]

## 2017-12-13 DIAGNOSIS — G43909 Migraine, unspecified, not intractable, without status migrainosus: Secondary | ICD-10-CM | POA: Insufficient documentation

## 2017-12-14 LAB — CULTURE, OB URINE

## 2017-12-14 LAB — URINE CULTURE, OB REFLEX

## 2017-12-18 LAB — CYTOLOGY - PAP
Chlamydia: NEGATIVE
Diagnosis: NEGATIVE
HPV: NOT DETECTED
NEISSERIA GONORRHEA: NEGATIVE

## 2017-12-19 LAB — SMN1 COPY NUMBER ANALYSIS (SMA CARRIER SCREENING)

## 2017-12-19 LAB — OBSTETRIC PANEL, INCLUDING HIV
ANTIBODY SCREEN: NEGATIVE
BASOS ABS: 0.1 10*3/uL (ref 0.0–0.2)
Basos: 1 %
EOS (ABSOLUTE): 0 10*3/uL (ref 0.0–0.4)
Eos: 1 %
HIV SCREEN 4TH GENERATION: NONREACTIVE
Hematocrit: 36.5 % (ref 34.0–46.6)
Hemoglobin: 11.9 g/dL (ref 11.1–15.9)
Hepatitis B Surface Ag: NEGATIVE
Immature Grans (Abs): 0 10*3/uL (ref 0.0–0.1)
Immature Granulocytes: 0 %
Lymphocytes Absolute: 1.3 10*3/uL (ref 0.7–3.1)
Lymphs: 18 %
MCH: 27.8 pg (ref 26.6–33.0)
MCHC: 32.6 g/dL (ref 31.5–35.7)
MCV: 85 fL (ref 79–97)
MONOCYTES: 7 %
Monocytes Absolute: 0.5 10*3/uL (ref 0.1–0.9)
NEUTROS ABS: 5.6 10*3/uL (ref 1.4–7.0)
Neutrophils: 73 %
PLATELETS: 184 10*3/uL (ref 150–450)
RBC: 4.28 x10E6/uL (ref 3.77–5.28)
RDW: 14 % (ref 12.3–15.4)
RPR Ser Ql: NONREACTIVE
RUBELLA: 7.03 {index} (ref >0.99)
Rh Factor: POSITIVE
WBC: 7.6 10*3/uL (ref 3.4–10.8)

## 2017-12-19 LAB — HEMOGLOBINOPATHY EVALUATION
FERRITIN: 18 ng/mL (ref 15–150)
HGB A2 QUANT: 2.6 % (ref 1.8–3.2)
HGB A: 97.4 % (ref 96.4–98.8)
HGB C: 0 %
HGB F QUANT: 0 % (ref 0.0–2.0)
HGB S: 0 %
Hgb Solubility: NEGATIVE
Hgb Variant: 0 %

## 2017-12-19 LAB — HEMOGLOBIN A1C
ESTIMATED AVERAGE GLUCOSE: 94 mg/dL
Hgb A1c MFr Bld: 4.9 % (ref 4.8–5.6)

## 2018-01-09 ENCOUNTER — Ambulatory Visit (INDEPENDENT_AMBULATORY_CARE_PROVIDER_SITE_OTHER): Payer: Medicaid Other | Admitting: Internal Medicine

## 2018-01-09 VITALS — BP 116/75 | HR 78 | Wt 124.3 lb

## 2018-01-09 DIAGNOSIS — Z3401 Encounter for supervision of normal first pregnancy, first trimester: Secondary | ICD-10-CM

## 2018-01-09 DIAGNOSIS — Z8679 Personal history of other diseases of the circulatory system: Secondary | ICD-10-CM

## 2018-01-09 DIAGNOSIS — O99891 Other specified diseases and conditions complicating pregnancy: Secondary | ICD-10-CM

## 2018-01-09 DIAGNOSIS — Z3492 Encounter for supervision of normal pregnancy, unspecified, second trimester: Secondary | ICD-10-CM

## 2018-01-09 DIAGNOSIS — O09521 Supervision of elderly multigravida, first trimester: Secondary | ICD-10-CM

## 2018-01-09 DIAGNOSIS — Z8759 Personal history of other complications of pregnancy, childbirth and the puerperium: Secondary | ICD-10-CM

## 2018-01-09 DIAGNOSIS — O9989 Other specified diseases and conditions complicating pregnancy, childbirth and the puerperium: Secondary | ICD-10-CM

## 2018-01-09 DIAGNOSIS — R8271 Bacteriuria: Secondary | ICD-10-CM

## 2018-01-09 NOTE — Progress Notes (Signed)
   PRENATAL VISIT NOTE  Subjective:  Lindsey Mcbride is a 37 y.o. [id number] at [redacted]w[redacted]d being seen today for ongoing prenatal care.  She is currently monitored for the following issues for this low-risk pregnancy and has Missed abortion; History of postpartum hypertension; Supervision of low-risk pregnancy; AMA (advanced maternal age) multigravida 35+, first trimester; and Migraines on their problem list.  Patient reports no complaints.  Contractions: Not present. Vag. Bleeding: None.  Movement: Absent. Denies leaking of fluid.   The following portions of the patient's history were reviewed and updated as appropriate: allergies, current medications, past family history, past medical history, past social history, past surgical history and problem list. Problem list updated.  Objective:   Vitals:   01/09/18 0920  BP: 116/75  Pulse: 78  Weight: 124 lb 4.8 oz (56.4 kg)    Fetal Status: Fetal Heart Rate (bpm): 152   Movement: Absent     General:  Alert, oriented and cooperative. Patient is in no acute distress.  Skin: Skin is warm and dry. No rash noted.   Cardiovascular: Normal heart rate noted  Respiratory: Normal respiratory effort, no problems with respiration noted  Abdomen: Soft, gravid, appropriate for gestational age.  Pain/Pressure: Present     Pelvic: Cervical exam deferred        Extremities: Normal range of motion.  Edema: None  Mental Status: Normal mood and affect. Normal behavior. Normal judgment and thought content.   Assessment and Plan:  Pregnancy: G4P1021 at [redacted]w[redacted]d  1. Supervision of low-risk pregnancy, first trimester - [place] MFM OB COMP + 14 WK; Future  2. AMA (advanced maternal age) multigravida 35+, first trimester Patient still considering genetic testing.   3. History of postpartum hypertension Plans to start taking ASA 81 mg today. BP well controlled today and patient asymptomatic.   4. Asymptomatic bacteriuria during pregnancy Repeat urine cx for lactobacillus  50-100k.  - Culture, OB Urine   Preterm labor symptoms and general obstetric precautions including but not limited to vaginal bleeding, contractions, leaking of fluid and fetal movement were reviewed in detail with the patient. Please refer to After Visit Summary for other counseling recommendations.  Return in about 4 weeks (around 02/06/2018) for routine PNC.  Future Appointments  Date Time Provider Department Center  02/04/2018 10:15 AM [facility] [place] 4 WH-MFCUS MFC-US    De Hollingshead, DO

## 2018-01-09 NOTE — Patient Instructions (Signed)
Second Trimester of Pregnancy The second trimester is from week 13 through week 28, month 4 through 6. This is often the time in pregnancy that you feel your best. Often times, morning sickness has lessened or quit. You may have more energy, and you may get hungry more often. Your unborn baby (fetus) is growing rapidly. At the end of the sixth month, he or she is about 9 inches long and weighs about 1 pounds. You will likely feel the baby move (quickening) between 18 and 20 weeks of pregnancy. Follow these instructions at home:  Avoid all smoking, herbs, and alcohol. Avoid drugs not approved by your doctor.  Do not use any tobacco products, including cigarettes, chewing tobacco, and electronic cigarettes. If you need help quitting, ask your doctor. You may get counseling or other support to help you quit.  Only take medicine as told by your doctor. Some medicines are safe and some are not during pregnancy.  Exercise only as told by your doctor. Stop exercising if you start having cramps.  Eat regular, healthy meals.  Wear a good support bra if your breasts are tender.  Do not use hot tubs, steam rooms, or saunas.  Wear your seat belt when driving.  Avoid raw meat, uncooked cheese, and liter boxes and soil used by cats.  Take your prenatal vitamins.  Take 1500-2000 milligrams of calcium daily starting at the 20th week of pregnancy until you deliver your baby.  Try taking medicine that helps you poop (stool softener) as needed, and if your doctor approves. Eat more fiber by eating fresh fruit, vegetables, and whole grains. Drink enough fluids to keep your pee (urine) clear or pale yellow.  Take warm water baths (sitz baths) to soothe pain or discomfort caused by hemorrhoids. Use hemorrhoid cream if your doctor approves.  If you have puffy, bulging veins (varicose veins), wear support hose. Raise (elevate) your feet for 15 minutes, 3-4 times a day. Limit salt in your diet.  Avoid heavy  lifting, wear low heals, and sit up straight.  Rest with your legs raised if you have leg cramps or low back pain.  Visit your dentist if you have not gone during your pregnancy. Use a soft toothbrush to brush your teeth. Be gentle when you floss.  You can have sex (intercourse) unless your doctor tells you not to.  Go to your doctor visits. Get help if:  You feel dizzy.  You have mild cramps or pressure in your lower belly (abdomen).  You have a nagging pain in your belly area.  You continue to feel sick to your stomach (nauseous), throw up (vomit), or have watery poop (diarrhea).  You have bad smelling fluid coming from your vagina.  You have pain with peeing (urination). Get help right away if:  You have a fever.  You are leaking fluid from your vagina.  You have spotting or bleeding from your vagina.  You have severe belly cramping or pain.  You lose or gain weight rapidly.  You have trouble catching your breath and have chest pain.  You notice sudden or extreme puffiness (swelling) of your face, hands, ankles, feet, or legs.  You have not felt the baby move in over an hour.  You have severe headaches that do not go away with medicine.  You have vision changes. This information is not intended to replace advice given to you by your health care provider. Make sure you discuss any questions you have with your health care   provider. Document Released: 05/16/2009 Document Revised: 07/28/2015 Document Reviewed: 04/22/2012 Elsevier Interactive Patient Education  2017 [employer]vier [employer]  

## 2018-01-12 LAB — URINE CULTURE, OB REFLEX

## 2018-01-12 LAB — CULTURE, OB URINE

## 2018-01-27 ENCOUNTER — Encounter (HOSPITAL_COMMUNITY): Payer: Self-pay

## 2018-02-04 ENCOUNTER — Other Ambulatory Visit: Payer: Self-pay | Admitting: Internal Medicine

## 2018-02-04 ENCOUNTER — Ambulatory Visit (HOSPITAL_COMMUNITY)
Admission: RE | Admit: 2018-02-04 | Discharge: 2018-02-04 | Disposition: A | Discharge status: Home / Self Care | Payer: Medicaid Other | Source: Ambulatory Visit | Attending: Internal Medicine | Admitting: Internal Medicine

## 2018-02-04 DIAGNOSIS — Z363 Encounter for antenatal screening for malformations: Secondary | ICD-10-CM | POA: Diagnosis not present

## 2018-02-04 DIAGNOSIS — O99891 Other specified diseases and conditions complicating pregnancy: Secondary | ICD-10-CM

## 2018-02-04 DIAGNOSIS — Z3A19 19 weeks gestation of pregnancy: Secondary | ICD-10-CM | POA: Insufficient documentation

## 2018-02-04 DIAGNOSIS — Z8679 Personal history of other diseases of the circulatory system: Secondary | ICD-10-CM

## 2018-02-04 DIAGNOSIS — Z3401 Encounter for supervision of normal first pregnancy, first trimester: Secondary | ICD-10-CM

## 2018-02-04 DIAGNOSIS — O09521 Supervision of elderly multigravida, first trimester: Secondary | ICD-10-CM

## 2018-02-04 DIAGNOSIS — O4692 Antepartum hemorrhage, unspecified, second trimester: Secondary | ICD-10-CM

## 2018-02-04 DIAGNOSIS — Z8759 Personal history of other complications of pregnancy, childbirth and the puerperium: Secondary | ICD-10-CM

## 2018-02-04 DIAGNOSIS — R8271 Bacteriuria: Secondary | ICD-10-CM

## 2018-02-04 DIAGNOSIS — O9989 Other specified diseases and conditions complicating pregnancy, childbirth and the puerperium: Secondary | ICD-10-CM

## 2018-02-04 DIAGNOSIS — O09522 Supervision of elderly multigravida, second trimester: Secondary | ICD-10-CM | POA: Diagnosis present

## 2018-02-06 ENCOUNTER — Ambulatory Visit (INDEPENDENT_AMBULATORY_CARE_PROVIDER_SITE_OTHER): Payer: Medicaid Other | Admitting: Obstetrics and Gynecology

## 2018-02-06 VITALS — BP 112/77 | HR 79 | Wt 128.8 lb

## 2018-02-06 DIAGNOSIS — O418X20 Other specified disorders of amniotic fluid and membranes, second trimester, not applicable or unspecified: Secondary | ICD-10-CM

## 2018-02-06 DIAGNOSIS — Z3A19 19 weeks gestation of pregnancy: Secondary | ICD-10-CM

## 2018-02-06 DIAGNOSIS — Z8759 Personal history of other complications of pregnancy, childbirth and the puerperium: Secondary | ICD-10-CM

## 2018-02-06 DIAGNOSIS — Z8679 Personal history of other diseases of the circulatory system: Secondary | ICD-10-CM

## 2018-02-06 DIAGNOSIS — Z3492 Encounter for supervision of normal pregnancy, unspecified, second trimester: Secondary | ICD-10-CM

## 2018-02-06 DIAGNOSIS — O468X2 Other antepartum hemorrhage, second trimester: Secondary | ICD-10-CM | POA: Insufficient documentation

## 2018-02-06 NOTE — Progress Notes (Signed)
   PRENATAL VISIT NOTE  Subjective:  Lindsey Mcbride is a 37 y.o. [id number] at 469w2d being seen today for ongoing prenatal care.  She is currently monitored for the following issues for this low-risk pregnancy and has Missed abortion; History of postpartum hypertension; Supervision of low-risk pregnancy; AMA (advanced maternal age) multigravida 35+, first trimester; Migraines; and Subchorionic hematoma in second trimester on their problem list.  Patient reports no complaints.  Contractions: Not present. Vag. Bleeding: None.  Movement: Present. Denies leaking of fluid.   The following portions of the patient's history were reviewed and updated as appropriate: allergies, current medications, past family history, past medical history, past social history, past surgical history and problem list. Problem list updated.  Objective:   Vitals:   02/06/18 0912  BP: 112/77  Pulse: 79  Weight: 128 lb 12.8 oz (58.4 kg)    Fetal Status: Fetal Heart Rate (bpm): 155   Movement: Present     General:  Alert, oriented and cooperative. Patient is in no acute distress.  Skin: Skin is warm and dry. No rash noted.   Cardiovascular: Normal heart rate noted  Respiratory: Normal respiratory effort, no problems with respiration noted  Abdomen: Soft, gravid, appropriate for gestational age.  Pain/Pressure: Present     Pelvic: Cervical exam deferred        Extremities: Normal range of motion.  Edema: None  Mental Status: Normal mood and affect. Normal behavior. Normal judgment and thought content.   Assessment and Plan:  Pregnancy: G4P1021 at 4137w2d  1. Subchorionic hematoma in second trimester, single or unspecified fetus  - No bleeding   2. Encounter for supervision of low-risk pregnancy in second trimester  - Doing well - Had questions about dating. Dating based on first trimester US.   3. History of postpartum hypertension  - She stopped taking BASA because she has lots of questions  - Patient is  concerned about history of preeclampsia with previous pregnancy; she was counseled about new studies that suggest that patients with a previous history of preeclampsia can take low dose aspirin (81mg ) daily after first trimester to help prevent preeclampsia.   Patient will think about ithis, do her research and make her decision.    There are no diagnoses linked to this encounter. Preterm labor symptoms and general obstetric precautions including but not limited to vaginal bleeding, contractions, leaking of fluid and fetal movement were reviewed in detail with the patient. Please refer to After Visit Summary for other counseling recommendations.  Return in about 4 weeks (around 03/06/2018).  Future Appointments  Date Time Provider Department Center  03/03/2018  7:45 AM WH-M[facility] US 5 WH-MFCUS MFC-US  03/06/2018  8:35 AM Galilea Quito, Harolyn RutherfordJennifer I, [facility]    Venia CarbonJennifer Carine Nordgren, NP

## 2018-02-06 NOTE — Patient Instructions (Signed)

## 2018-02-11 ENCOUNTER — Other Ambulatory Visit (HOSPITAL_COMMUNITY): Payer: Self-pay | Admitting: *Deleted

## 2018-02-11 DIAGNOSIS — O09529 Supervision of elderly multigravida, unspecified trimester: Secondary | ICD-10-CM

## 2018-03-03 ENCOUNTER — Encounter (HOSPITAL_COMMUNITY): Payer: Self-pay

## 2018-03-03 ENCOUNTER — Ambulatory Visit (HOSPITAL_COMMUNITY): Payer: Medicaid Other | Attending: Internal Medicine

## 2018-03-06 ENCOUNTER — Ambulatory Visit (INDEPENDENT_AMBULATORY_CARE_PROVIDER_SITE_OTHER): Payer: Medicaid Other | Admitting: Obstetrics and Gynecology

## 2018-03-06 DIAGNOSIS — Z3492 Encounter for supervision of normal pregnancy, unspecified, second trimester: Secondary | ICD-10-CM

## 2018-03-06 NOTE — Progress Notes (Signed)
   PRENATAL VISIT NOTE  Subjective:  Lindsey Mcbride is a 38 y.o. [id number] at [redacted]w[redacted]d being seen today for ongoing prenatal care.  She is currently monitored for the following issues for this low-risk pregnancy and has Missed abortion; History of postpartum hypertension; Supervision of low-risk pregnancy; AMA (advanced maternal age) multigravida 35+, first trimester; Migraines; and Subchorionic hematoma in second trimester on their problem list.  Patient reports no complaints.  Contractions: Not present. Vag. Bleeding: None.  Movement: Present. Denies leaking of fluid.   The following portions of the patient's history were reviewed and updated as appropriate: allergies, current medications, past family history, past medical history, past social history, past surgical history and problem list. Problem list updated.  Objective:   Vitals:   03/06/18 0855  BP: 106/70  Pulse: (!) 106  Weight: 133 lb 3.2 oz (60.4 kg)    Fetal Status: Fetal Heart Rate (bpm): 151 Fundal Height: 23 cm Movement: Present     General:  Alert, oriented and cooperative. Patient is in no acute distress.  Skin: Skin is warm and dry. No rash noted.   Cardiovascular: Normal heart rate noted  Respiratory: Normal respiratory effort, no problems with respiration noted  Abdomen: Soft, gravid, appropriate for gestational age.  Pain/Pressure: Present     Pelvic: Cervical exam deferred        Extremities: Normal range of motion.  Edema: Trace  Mental Status: Normal mood and affect. Normal behavior. Normal judgment and thought content.   Assessment and Plan:  Pregnancy: G4P1021 at [redacted]w[redacted]d  1. Encounter for supervision of low-risk pregnancy in second trimester  - Doing well - 2 hour GTT next visit  There are no diagnoses linked to this encounter. Preterm labor symptoms and general obstetric precautions including but not limited to vaginal bleeding, contractions, leaking of fluid and fetal movement were reviewed in detail with  the patient. Please refer to After Visit Summary for other counseling recommendations.  Return for Please schedule 2 hour GTT for next visit. Come fasting. .  Future Appointments  Date Time Provider Department Center  04/03/2018  8:20 AM [facility]OC-[facility] LAB [facility] WOC  04/03/2018  8:55 AM Stacey Maura, Harolyn Rutherford, NP [facility]    Venia Carbon, NP

## 2018-04-02 ENCOUNTER — Other Ambulatory Visit: Payer: Self-pay | Admitting: *Deleted

## 2018-04-02 DIAGNOSIS — Z3492 Encounter for supervision of normal pregnancy, unspecified, second trimester: Secondary | ICD-10-CM

## 2018-04-03 ENCOUNTER — Ambulatory Visit (INDEPENDENT_AMBULATORY_CARE_PROVIDER_SITE_OTHER): Payer: Medicaid Other | Admitting: Obstetrics and Gynecology

## 2018-04-03 ENCOUNTER — Other Ambulatory Visit: Payer: Medicaid Other

## 2018-04-03 ENCOUNTER — Encounter: Payer: Self-pay | Admitting: Obstetrics and Gynecology

## 2018-04-03 VITALS — BP 105/72 | HR 74 | Wt 136.3 lb

## 2018-04-03 DIAGNOSIS — Z3492 Encounter for supervision of normal pregnancy, unspecified, second trimester: Secondary | ICD-10-CM

## 2018-04-03 DIAGNOSIS — Z8759 Personal history of other complications of pregnancy, childbirth and the puerperium: Secondary | ICD-10-CM

## 2018-04-03 DIAGNOSIS — Z8679 Personal history of other diseases of the circulatory system: Secondary | ICD-10-CM

## 2018-04-03 NOTE — Progress Notes (Signed)
   PRENATAL VISIT NOTE  Subjective:  Lindsey Mcbride is a 38 y.o. [id number] at [redacted]w[redacted]d being seen today for ongoing prenatal care.  She is currently monitored for the following issues for this low-risk pregnancy and has Missed abortion; History of postpartum hypertension; Supervision of low-risk pregnancy; AMA (advanced maternal age) multigravida 35+, first trimester; Migraines; and Subchorionic hematoma in second trimester on their problem list.  Patient reports no complaints.  Contractions: Not present. Vag. Bleeding: None.  Movement: Present. Denies leaking of fluid.   The following portions of the patient's history were reviewed and updated as appropriate: allergies, current medications, past family history, past medical history, past social history, past surgical history and problem list. Problem list updated.  Objective:   Vitals:   04/03/18 0957  BP: 105/72  Pulse: 74  Weight: 136 lb 4.8 oz (61.8 kg)    Fetal Status: Fetal Heart Rate (bpm): 145 Fundal Height: 27 cm Movement: Present     General:  Alert, oriented and cooperative. Patient is in no acute distress.  Skin: Skin is warm and dry. No rash noted.   Cardiovascular: Normal heart rate noted  Respiratory: Normal respiratory effort, no problems with respiration noted  Abdomen: Soft, gravid, appropriate for gestational age.  Pain/Pressure: Present     Pelvic: Cervical exam deferred        Extremities: Normal range of motion.  Edema: Trace  Mental Status: Normal mood and affect. Normal behavior. Normal judgment and thought content.   Assessment and Plan:  Pregnancy: G4P1021 at [redacted]w[redacted]d   1. History of postpartum hypertension  - continue BASA  - BP good today.  2. Encounter for supervision of low-risk pregnancy in second trimester  - doing well - 2 hour today   There are no diagnoses linked to this encounter. Preterm labor symptoms and general obstetric precautions including but not limited to vaginal bleeding,  contractions, leaking of fluid and fetal movement were reviewed in detail with the patient. Please refer to After Visit Summary for other counseling recommendations.  No follow-ups on file.  Future Appointments  Date Time Provider Department Center  04/24/2018  1:15 PM Rasch, Harolyn Rutherford, NP [facility]    Venia Carbon, NP

## 2018-04-04 LAB — CBC
Hematocrit: 35.2 % (ref 34.0–46.6)
Hemoglobin: 12.3 g/dL (ref 11.1–15.9)
MCH: 30.9 pg (ref 26.6–33.0)
MCHC: 34.9 g/dL (ref 31.5–35.7)
MCV: 88 fL (ref 79–97)
Platelets: 147 10*3/uL — ABNORMAL LOW (ref 150–450)
RBC: 3.98 x10E6/uL (ref 3.77–5.28)
RDW: 11.9 % (ref 11.7–15.4)
WBC: 6.6 10*3/uL (ref 3.4–10.8)

## 2018-04-04 LAB — GLUCOSE TOLERANCE, 2 HOURS W/ 1HR
Glucose, 1 hour: 150 mg/dL (ref 65–179)
Glucose, 2 hour: 117 mg/dL (ref 65–152)
Glucose, Fasting: 73 mg/dL (ref 65–91)

## 2018-04-04 LAB — HIV ANTIBODY (ROUTINE TESTING W REFLEX): HIV Screen 4th Generation wRfx: NONREACTIVE

## 2018-04-04 LAB — RPR: RPR: NONREACTIVE

## 2018-04-05 ENCOUNTER — Encounter: Payer: Self-pay | Admitting: Obstetrics and Gynecology

## 2018-04-05 DIAGNOSIS — O99119 Other diseases of the blood and blood-forming organs and certain disorders involving the immune mechanism complicating pregnancy, unspecified trimester: Secondary | ICD-10-CM

## 2018-04-05 DIAGNOSIS — D696 Thrombocytopenia, unspecified: Secondary | ICD-10-CM | POA: Insufficient documentation

## 2018-04-24 ENCOUNTER — Ambulatory Visit (INDEPENDENT_AMBULATORY_CARE_PROVIDER_SITE_OTHER): Payer: Medicaid Other | Admitting: Obstetrics and Gynecology

## 2018-04-24 VITALS — BP 111/67 | HR 76 | Wt 140.0 lb

## 2018-04-24 DIAGNOSIS — Z3A30 30 weeks gestation of pregnancy: Secondary | ICD-10-CM | POA: Diagnosis not present

## 2018-04-24 DIAGNOSIS — Z3492 Encounter for supervision of normal pregnancy, unspecified, second trimester: Secondary | ICD-10-CM

## 2018-04-24 DIAGNOSIS — Z23 Encounter for immunization: Secondary | ICD-10-CM | POA: Diagnosis not present

## 2018-04-24 NOTE — Progress Notes (Signed)
   PRENATAL VISIT NOTE  Subjective:  Lindsey Mcbride is a 38 y.o. [id number] at [redacted]w[redacted]d being seen today for ongoing prenatal care.  She is currently monitored for the following issues for this low-risk pregnancy and has Missed abortion; History of postpartum hypertension; Supervision of low-risk pregnancy; AMA (advanced maternal age) multigravida 35+, first trimester; Migraines; Subchorionic hematoma in second trimester; and Thrombocytopenia affecting pregnancy (HCC) on their problem list.  Patient reports no complaints.  Contractions: Not present. Vag. Bleeding: None.  Movement: Present. Denies leaking of fluid.   The following portions of the patient's history were reviewed and updated as appropriate: allergies, current medications, past family history, past medical history, past social history, past surgical history and problem list. Problem list updated.  Objective:   Vitals:   04/24/18 1329  BP: 111/67  Pulse: 76  Weight: 140 lb (63.5 kg)    Fetal Status: Fetal Heart Rate (bpm): 147 Fundal Height: 31 cm Movement: Present     General:  Alert, oriented and cooperative. Patient is in no acute distress.  Skin: Skin is warm and dry. No rash noted.   Cardiovascular: Normal heart rate noted  Respiratory: Normal respiratory effort, no problems with respiration noted  Abdomen: Soft, gravid, appropriate for gestational age.  Pain/Pressure: Present     Pelvic: Cervical exam deferred        Extremities: Normal range of motion.  Edema: Trace  Mental Status: Normal mood and affect. Normal behavior. Normal judgment and thought content.   Assessment and Plan:  Pregnancy: G4P1021 at [redacted]w[redacted]d  1. Encounter for supervision of low-risk pregnancy in second trimester  - Tdap vaccine greater than or equal to 7yo IM - Moving next month, requesting referral to [facility]. Will discuss as it gets closer to move.   There are no diagnoses linked to this encounter. Preterm labor symptoms and general obstetric  precautions including but not limited to vaginal bleeding, contractions, leaking of fluid and fetal movement were reviewed in detail with the patient. Please refer to After Visit Summary for other counseling recommendations.  No follow-ups on file.  Future Appointments  Date Time Provider Department Center  05/08/2018  1:55 PM Rasch, Harolyn Rutherford, NP [facility] WOC  05/22/2018  1:55 PM Rasch, Harolyn Rutherford, NP WOC-WOCA WOC  06/05/2018  1:55 PM Rasch, Harolyn Rutherford, NP [facility] WOC    Venia Carbon, NP

## 2018-05-08 ENCOUNTER — Ambulatory Visit (INDEPENDENT_AMBULATORY_CARE_PROVIDER_SITE_OTHER): Payer: Medicaid Other | Admitting: Obstetrics and Gynecology

## 2018-05-08 DIAGNOSIS — Z3A32 32 weeks gestation of pregnancy: Secondary | ICD-10-CM

## 2018-05-08 DIAGNOSIS — Z3492 Encounter for supervision of normal pregnancy, unspecified, second trimester: Secondary | ICD-10-CM

## 2018-05-08 DIAGNOSIS — Z3493 Encounter for supervision of normal pregnancy, unspecified, third trimester: Secondary | ICD-10-CM

## 2018-05-08 NOTE — Progress Notes (Signed)
   PRENATAL VISIT NOTE  Subjective:  Lindsey Mcbride is a 38 y.o. [id number] at [redacted]w[redacted]d being seen today for ongoing prenatal care.  She is currently monitored for the following issues for this low-risk pregnancy and has Missed abortion; History of postpartum hypertension; Supervision of low-risk pregnancy; AMA (advanced maternal age) multigravida 35+, first trimester; Migraines; Subchorionic hematoma in second trimester; and Thrombocytopenia affecting pregnancy (HCC) on their problem list.  Patient reports no complaints.  Contractions: Not present. Vag. Bleeding: None.  Movement: Present. Denies leaking of fluid.   The following portions of the patient's history were reviewed and updated as appropriate: allergies, current medications, past family history, past medical history, past social history, past surgical history and problem list. Problem list updated.  Objective:   Vitals:   05/08/18 1359  BP: 109/76  Pulse: 77  Weight: 139 lb (63 kg)    Fetal Status: Fetal Heart Rate (bpm): 145 Fundal Height: 33 cm Movement: Present     General:  Alert, oriented and cooperative. Patient is in no acute distress.  Skin: Skin is warm and dry. No rash noted.   Cardiovascular: Normal heart rate noted  Respiratory: Normal respiratory effort, no problems with respiration noted  Abdomen: Soft, gravid, appropriate for gestational age.  Pain/Pressure: Present     Pelvic: Cervical exam deferred        Extremities: Normal range of motion.  Edema: Trace  Mental Status: Normal mood and affect. Normal behavior. Normal judgment and thought content.   Assessment and Plan:  Pregnancy: G4P1021 at [redacted]w[redacted]d  1. Encounter for supervision of low-risk pregnancy in second trimester  Doing well. No complaints. Some insomnia. Discussed using magnesium OTC Patient moved to [place] and is transferring care there.  [facility] women[facility] clinic [address] [place], [place], [place] [address]   There are no diagnoses linked to this  encounter. Preterm labor symptoms and general obstetric precautions including but not limited to vaginal bleeding, contractions, leaking of fluid and fetal movement were reviewed in detail with the patient. Please refer to After Visit Summary for other counseling recommendations.  No follow-ups on file.  Future Appointments  Date Time Provider Department Center  05/22/2018  1:55 PM Rasch, Harolyn Rutherford, NP WOC-WOCA WOC  06/05/2018  1:55 PM Rasch, Harolyn Rutherford, NP [facility] WOC    Venia Carbon, NP

## 2018-05-22 ENCOUNTER — Encounter: Payer: Self-pay | Admitting: Obstetrics and Gynecology

## 2018-06-04 ENCOUNTER — Telehealth: Payer: Self-pay | Admitting: Family Medicine

## 2018-06-04 ENCOUNTER — Other Ambulatory Visit (HOSPITAL_COMMUNITY)
Admission: RE | Admit: 2018-06-04 | Discharge: 2018-06-04 | Disposition: A | Discharge status: Home / Self Care | Payer: Medicaid Other | Source: Ambulatory Visit | Attending: Obstetrics and Gynecology | Admitting: Obstetrics and Gynecology

## 2018-06-04 ENCOUNTER — Ambulatory Visit (INDEPENDENT_AMBULATORY_CARE_PROVIDER_SITE_OTHER): Payer: Medicaid Other | Admitting: Nurse Practitioner

## 2018-06-04 ENCOUNTER — Other Ambulatory Visit: Payer: Self-pay

## 2018-06-04 VITALS — BP 120/79 | HR 81 | Temp 98.0°F | Wt 154.0 lb

## 2018-06-04 DIAGNOSIS — Z3A36 36 weeks gestation of pregnancy: Secondary | ICD-10-CM

## 2018-06-04 DIAGNOSIS — Z3492 Encounter for supervision of normal pregnancy, unspecified, second trimester: Secondary | ICD-10-CM | POA: Diagnosis present

## 2018-06-04 DIAGNOSIS — Z3493 Encounter for supervision of normal pregnancy, unspecified, third trimester: Secondary | ICD-10-CM

## 2018-06-04 NOTE — Patient Instructions (Signed)

## 2018-06-04 NOTE — Telephone Encounter (Signed)
The patient called to request that she come in today due to the issues with child care. Received advise from nurse Lyla Son- the patient needs to be seen today. [name] stated that she would see the patient today.

## 2018-06-04 NOTE — Progress Notes (Signed)
Babyscripts set up and verified and bp cuff given

## 2018-06-04 NOTE — Progress Notes (Signed)
    Subjective:  Lindsey Mcbride is a 38 y.o. X6I6803 at [redacted]w[redacted]d being seen today for ongoing prenatal care.  She is currently monitored for the following issues for this low-risk pregnancy and has Missed abortion; History of postpartum hypertension; Supervision of low-risk pregnancy; AMA (advanced maternal age) multigravida 35+, first trimester; Migraines; Subchorionic hematoma in second trimester; and Thrombocytopenia affecting pregnancy (HCC) on their problem list.  Patient reports no complaints.  Contractions: Not present. Vag. Bleeding: None.  Movement: Present. Denies leaking of fluid.   The following portions of the patient's history were reviewed and updated as appropriate: allergies, current medications, past family history, past medical history, past social history, past surgical history and problem list. Problem list updated.  Objective:   Vitals:   06/04/18 1604  BP: 120/79  Pulse: 81  Temp: 98 F (36.7 C)  Weight: 154 lb (69.9 kg)    Fetal Status: Fetal Heart Rate (bpm): 154 Fundal Height: 34 cm Movement: Present  Presentation: Vertex  General:  Alert, oriented and cooperative. Patient is in no acute distress.  Skin: Skin is warm and dry. No rash noted.   Cardiovascular: Normal heart rate noted  Respiratory: Normal respiratory effort, no problems with respiration noted  Abdomen: Soft, gravid, appropriate for gestational age. Pain/Pressure: Present     Pelvic:  Cervical exam performed Dilation: 1 Effacement (%): Thick Station: -2  Extremities: Normal range of motion.  Edema: Trace  Mental Status: Normal mood and affect. Normal behavior. Normal judgment and thought content.   Urinalysis:      Assessment and Plan:  Pregnancy: G4P1021 at [redacted]w[redacted]d  1. Encounter for supervision of low-risk pregnancy Client lives in [place] now.  Using [phone] precautions.  Demonstrated babyscript documentation of BP in the office.  Will be seen again in 4 weeks.  Baby has been moving every day  - advised to call the office if she is ill, fever or cough, or the baby is not moving.  Reviewed preeclampsia symptoms of headache, blurred vision or increasing edema.  - Culture, beta strep (group b only) - GC/Chlamydia probe amp ()not at [facility] AMB BABYSCRIPTS SCHEDULE OPTIMIZATION  Term labor symptoms and general obstetric precautions including but not limited to vaginal bleeding, contractions, leaking of fluid and fetal movement were reviewed in detail with the patient. Please refer to After Visit Summary for other counseling recommendations.  Return in about 4 weeks (around 07/02/2018) for 40 weeks visit and NST.  Nolene Bernheim, RN, MSN, NP-BC Nurse Practitioner, [facility] for [employer], [facility] 06/04/2018 4:58 PM

## 2018-06-05 ENCOUNTER — Encounter: Payer: Self-pay | Admitting: Obstetrics and Gynecology

## 2018-06-05 LAB — GC/CHLAMYDIA PROBE AMP (~~LOC~~) NOT AT ARMC
Chlamydia: NEGATIVE
Neisseria Gonorrhea: NEGATIVE

## 2018-06-08 LAB — CULTURE, BETA STREP (GROUP B ONLY): Strep Gp B Culture: NEGATIVE

## 2018-06-27 ENCOUNTER — Telehealth: Payer: Self-pay | Admitting: Family Medicine

## 2018-06-27 NOTE — Telephone Encounter (Signed)
Left Detailed message about appt change

## 2018-07-02 ENCOUNTER — Other Ambulatory Visit: Payer: Self-pay

## 2018-07-02 ENCOUNTER — Encounter: Payer: Self-pay | Admitting: Medical

## 2018-07-02 ENCOUNTER — Telehealth (INDEPENDENT_AMBULATORY_CARE_PROVIDER_SITE_OTHER): Payer: Medicaid Other | Admitting: *Deleted

## 2018-07-02 DIAGNOSIS — Z3490 Encounter for supervision of normal pregnancy, unspecified, unspecified trimester: Secondary | ICD-10-CM

## 2018-07-02 NOTE — Telephone Encounter (Signed)
Called pt regarding babyscripts.  Pt states she has not been logging BP because she delivered the baby 1.5 weeks ago.  Pt states she and the baby are doing well.

## 2018-09-23 ENCOUNTER — Encounter (HOSPITAL_COMMUNITY): Payer: Self-pay

## 2020-04-17 IMAGING — US US OB < 14 WEEKS - US OB TV
1 series · 15 of 28 positions shown · non-contrast
Comparison: None.

CLINICAL DATA: Right lower quadrant pain, beta HCG 44,502

EXAM:
OBSTETRIC <14 WK US AND TRANSVAGINAL OB US
TECHNIQUE: Both transabdominal and transvaginal ultrasound examinations were
performed for complete evaluation of the gestation as well as the
maternal uterus, adnexal regions, and pelvic cul-de-sac.
Transvaginal technique was performed to assess early pregnancy.

[Series 1: us ob < 14 weeks - us ob tv · 15 of 76 slices shown]
[im 1/76]
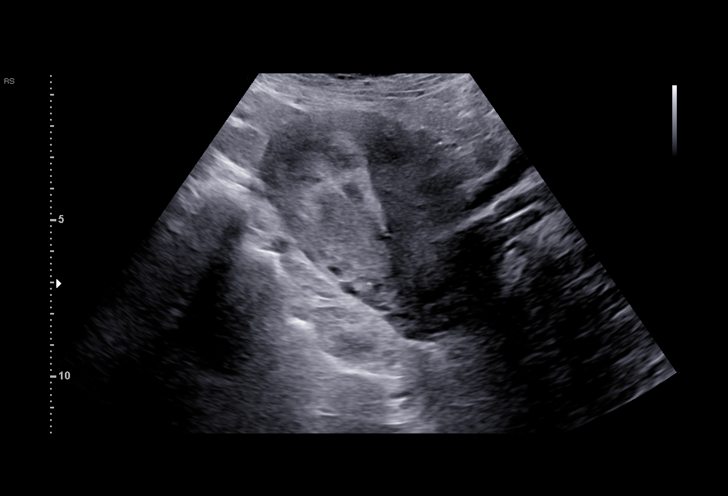
[im 6/76]
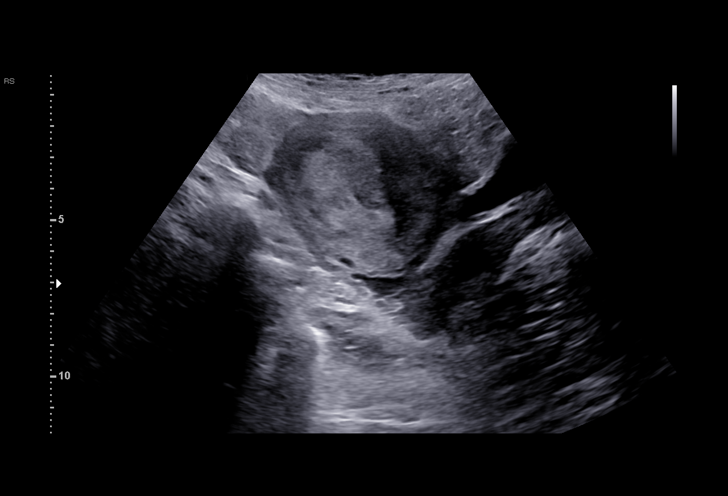
[im 12/76]
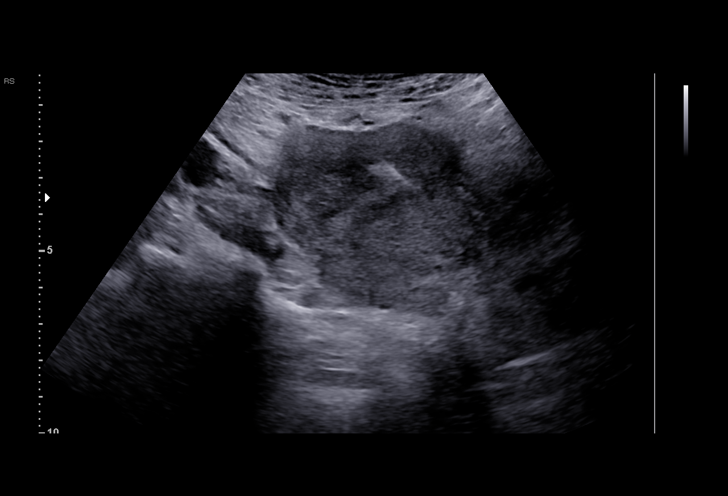
[im 17/76]
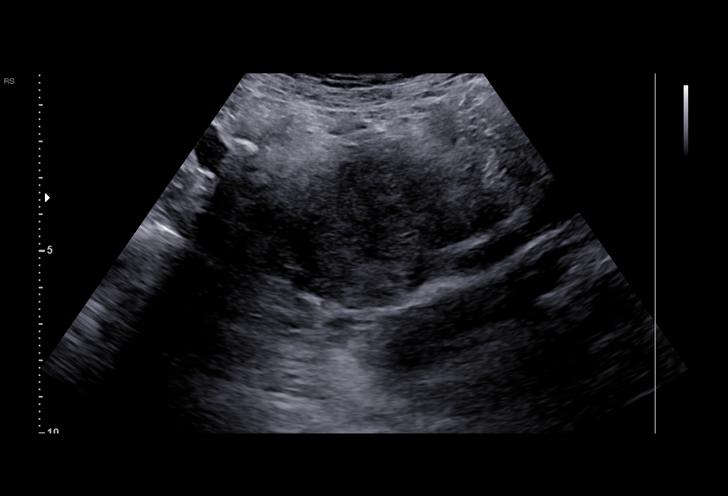
[im 23/76]
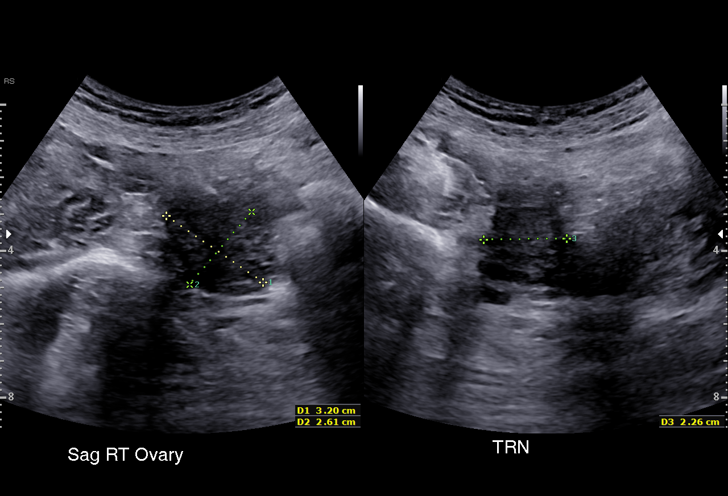
[im 28/76]
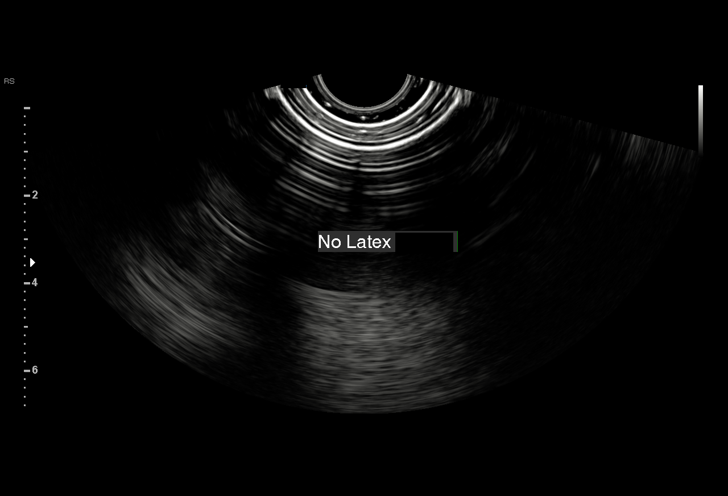
[im 34/76]
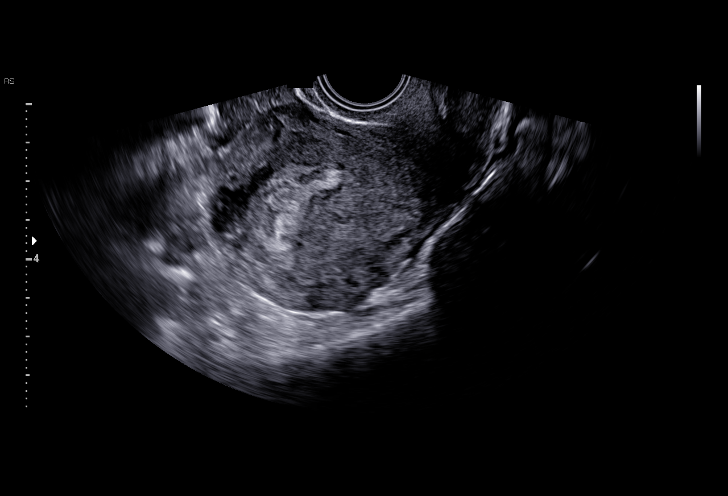
[im 39/76]
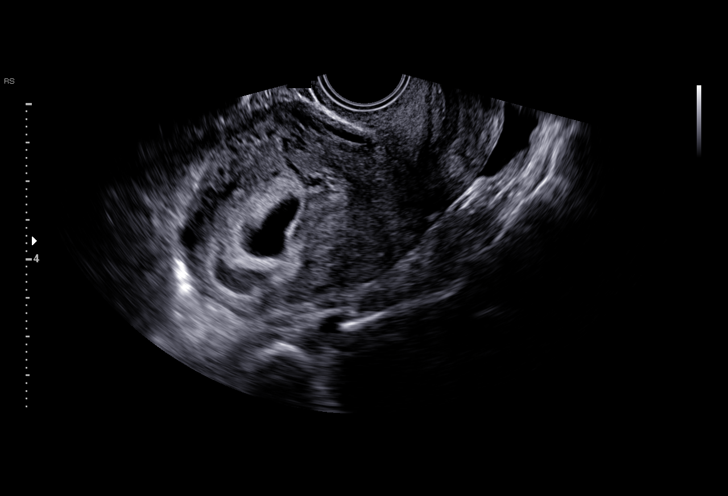
[im 42/76]
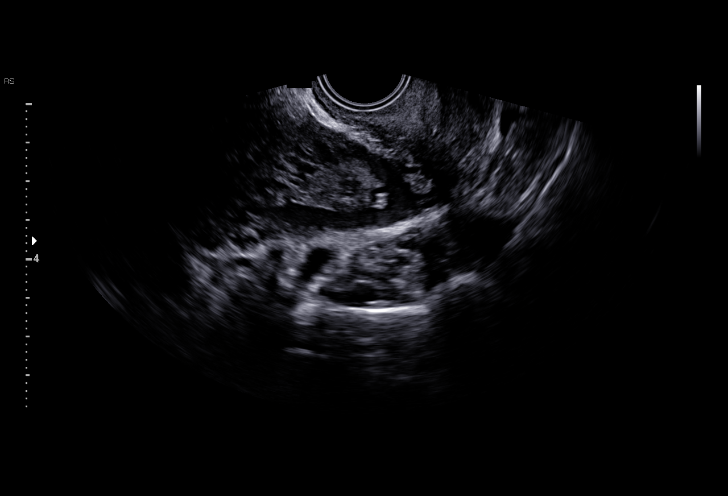
[im 48/76]
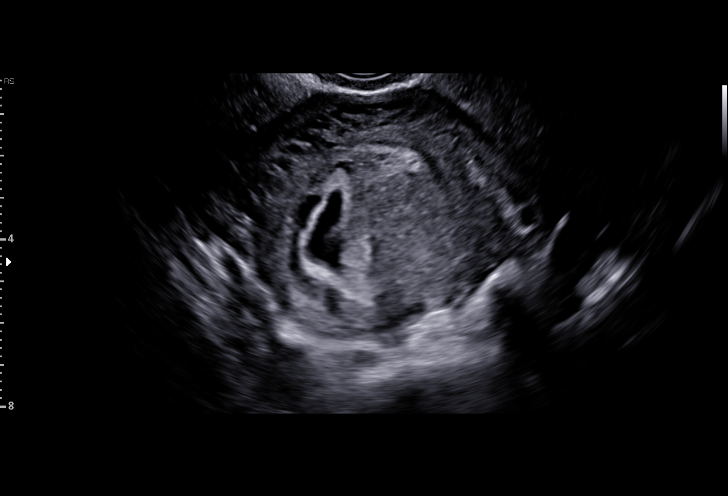
[im 53/76]
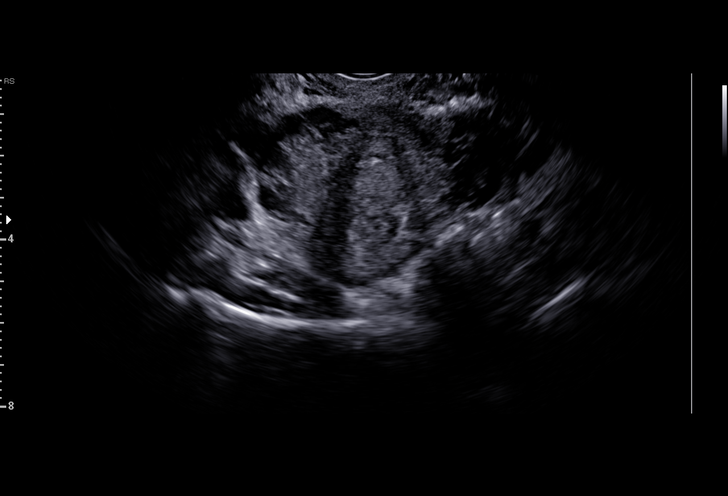
[im 59/76]
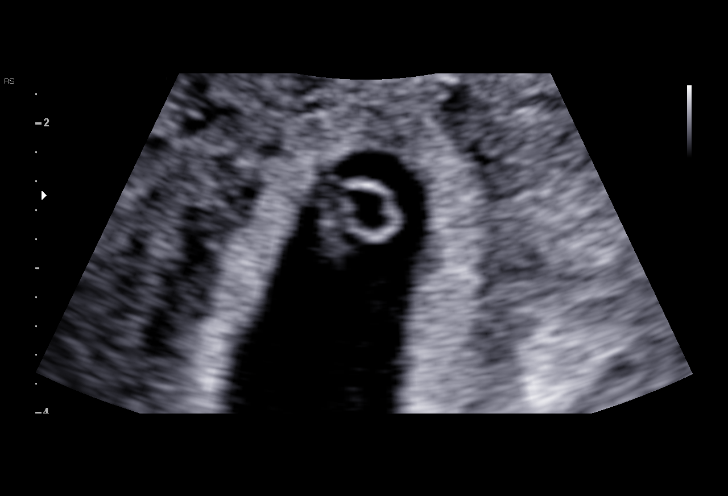
[im 64/76]
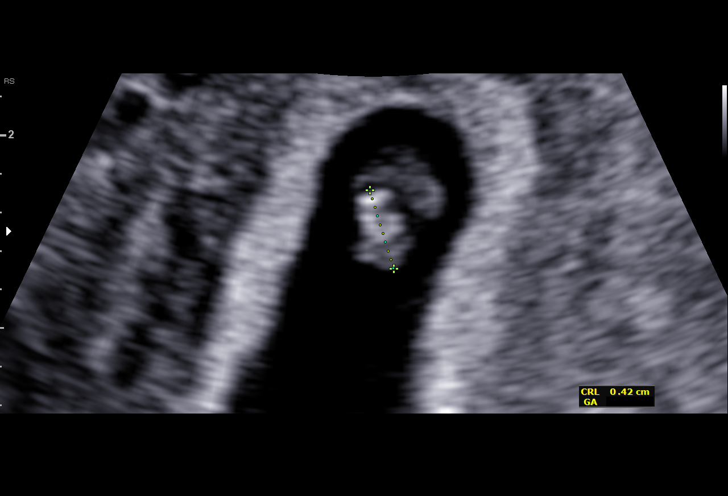
[im 70/76]
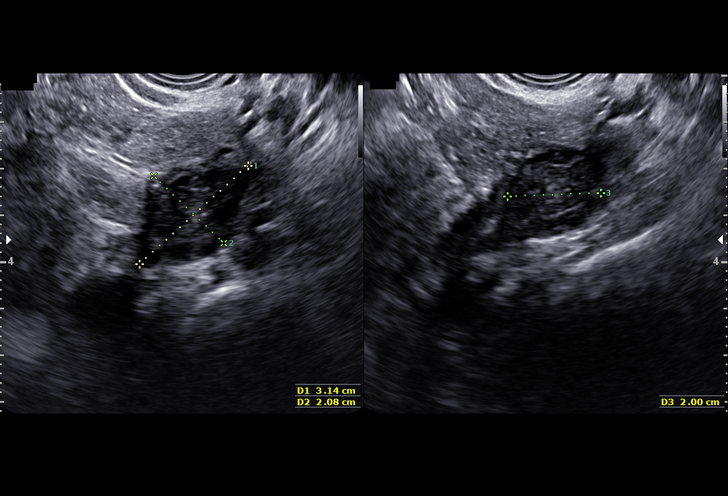
[im 76/76]
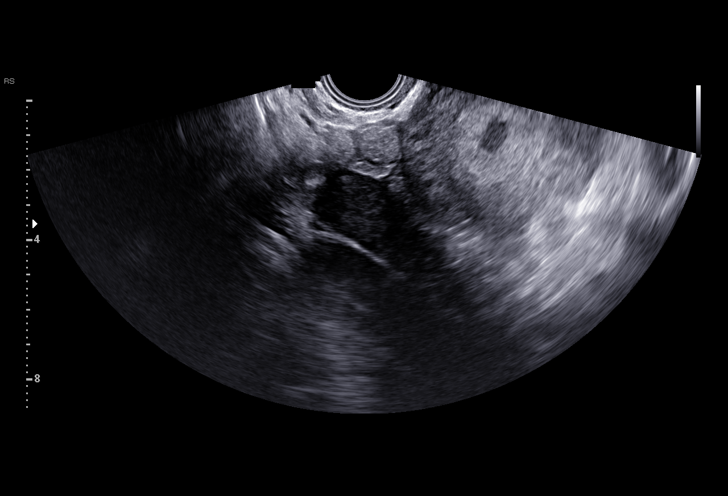

[15 of 28 positions shown; findings below may reference images not displayed]

FINDINGS: Intrauterine gestational sac: Single though slightly irregular in
shape.

Yolk sac:  Present

Embryo:  Present

Cardiac Activity: Present

Heart Rate: 123 bpm

CRL:  4.2 mm   6 w   1 d                  US EDC: 07/01/2018

Subchorionic hemorrhage: Crescentic subchorionic focus of hemorrhage
is identified measuring up to 4.7 cm in length.

Maternal uterus/adnexae: Corpus luteum on the right. Trace free
fluid. Normal left ovary.
IMPRESSION: Viable 6 week 1 day gestational subchorionic focus of hemorrhage
measuring up to 4.7 cm in length.
